# Patient Record
Sex: Female | Born: 1945 | Race: White | Hispanic: No | Smoking: Former smoker
Health system: Southern US, Community
[De-identification: ages and names within clinical notes are randomized; demographics above are authoritative.]

## PROBLEM LIST (undated history)

## (undated) DIAGNOSIS — M1990 Unspecified osteoarthritis, unspecified site: Secondary | ICD-10-CM

## (undated) DIAGNOSIS — I10 Essential (primary) hypertension: Secondary | ICD-10-CM

## (undated) DIAGNOSIS — Z9889 Other specified postprocedural states: Secondary | ICD-10-CM

## (undated) DIAGNOSIS — F419 Anxiety disorder, unspecified: Secondary | ICD-10-CM

## (undated) DIAGNOSIS — F32A Depression, unspecified: Secondary | ICD-10-CM

## (undated) DIAGNOSIS — C801 Malignant (primary) neoplasm, unspecified: Secondary | ICD-10-CM

## (undated) DIAGNOSIS — J45909 Unspecified asthma, uncomplicated: Secondary | ICD-10-CM

## (undated) DIAGNOSIS — E119 Type 2 diabetes mellitus without complications: Secondary | ICD-10-CM

## (undated) DIAGNOSIS — R112 Nausea with vomiting, unspecified: Secondary | ICD-10-CM

## (undated) DIAGNOSIS — J449 Chronic obstructive pulmonary disease, unspecified: Secondary | ICD-10-CM

## (undated) HISTORY — DX: Anxiety disorder, unspecified: F41.9

## (undated) HISTORY — DX: Depression, unspecified: F32.A

## (undated) HISTORY — PX: TUBAL LIGATION: SHX77

## (undated) HISTORY — DX: Nausea with vomiting, unspecified: R11.2

---

## 2004-11-06 ENCOUNTER — Ambulatory Visit: Payer: Self-pay | Admitting: Family Medicine

## 2004-11-09 ENCOUNTER — Ambulatory Visit: Payer: Self-pay | Admitting: Family Medicine

## 2004-11-23 ENCOUNTER — Ambulatory Visit: Payer: Self-pay | Admitting: Family Medicine

## 2007-08-27 ENCOUNTER — Encounter: Payer: Self-pay | Admitting: Family Medicine

## 2009-08-26 DIAGNOSIS — Z9889 Other specified postprocedural states: Secondary | ICD-10-CM

## 2009-08-26 HISTORY — PX: CARDIAC CATHETERIZATION: SHX172

## 2009-08-26 HISTORY — DX: Other specified postprocedural states: Z98.890

## 2010-09-25 NOTE — Letter (Signed)
Summary: rpc chart  rpc chart   Imported By: Curtis Sites 05/28/2010 11:23:43  _____________________________________________________________________  External Attachment:    Type:   Image     Comment:   External Document

## 2011-05-05 ENCOUNTER — Observation Stay (HOSPITAL_COMMUNITY)
Admission: AD | Admit: 2011-05-05 | Discharge: 2011-05-06 | DRG: 287 | Disposition: A | Discharge status: Home / Self Care | Payer: Medicare Other | Source: Other Acute Inpatient Hospital | Attending: Cardiovascular Disease | Admitting: Cardiovascular Disease

## 2011-05-05 DIAGNOSIS — J4489 Other specified chronic obstructive pulmonary disease: Secondary | ICD-10-CM | POA: Insufficient documentation

## 2011-05-05 DIAGNOSIS — R079 Chest pain, unspecified: Secondary | ICD-10-CM | POA: Insufficient documentation

## 2011-05-05 DIAGNOSIS — I2510 Atherosclerotic heart disease of native coronary artery without angina pectoris: Principal | ICD-10-CM | POA: Insufficient documentation

## 2011-05-05 DIAGNOSIS — I252 Old myocardial infarction: Secondary | ICD-10-CM | POA: Insufficient documentation

## 2011-05-05 DIAGNOSIS — J449 Chronic obstructive pulmonary disease, unspecified: Secondary | ICD-10-CM | POA: Insufficient documentation

## 2011-05-05 DIAGNOSIS — I10 Essential (primary) hypertension: Secondary | ICD-10-CM | POA: Insufficient documentation

## 2011-05-05 DIAGNOSIS — E785 Hyperlipidemia, unspecified: Secondary | ICD-10-CM | POA: Insufficient documentation

## 2011-05-05 DIAGNOSIS — E119 Type 2 diabetes mellitus without complications: Secondary | ICD-10-CM | POA: Insufficient documentation

## 2011-05-05 LAB — MRSA PCR SCREENING: MRSA by PCR: NEGATIVE

## 2011-05-05 LAB — HEPARIN LEVEL (UNFRACTIONATED): Heparin Unfractionated: 0.23 IU/mL — ABNORMAL LOW (ref 0.30–0.70)

## 2011-05-06 LAB — BASIC METABOLIC PANEL
BUN: 16 mg/dL (ref 6–23)
CO2: 31 mEq/L (ref 19–32)
Calcium: 9 mg/dL (ref 8.4–10.5)
Chloride: 105 mEq/L (ref 96–112)
Creatinine, Ser: 0.83 mg/dL (ref 0.50–1.10)
GFR calc Af Amer: >60 mL/min (ref >60)
GFR calc non Af Amer: >60 mL/min (ref >60)
Glucose, Bld: 121 mg/dL — ABNORMAL HIGH (ref 70–99)
Potassium: 3.5 mEq/L (ref 3.5–5.1)
Sodium: 143 mEq/L (ref 135–145)

## 2011-05-06 LAB — CBC
MCHC: 35.3 g/dL (ref 30.0–36.0)
MCV: 86.1 fL (ref 78.0–100.0)
Platelets: 198 10*3/uL (ref 150–400)
RDW: 13.1 % (ref 11.5–15.5)
WBC: 6.9 10*3/uL (ref 4.0–10.5)

## 2011-05-06 LAB — HEPARIN LEVEL (UNFRACTIONATED): Heparin Unfractionated: 0.32 IU/mL (ref 0.30–0.70)

## 2011-05-06 LAB — POCT ACTIVATED CLOTTING TIME: Activated Clotting Time: 100 seconds

## 2011-05-06 LAB — PROTIME-INR
INR: 0.99 (ref 0.00–1.49)
Prothrombin Time: 13.3 seconds (ref 11.6–15.2)

## 2011-05-06 LAB — GLUCOSE, CAPILLARY: Glucose-Capillary: 120 mg/dL — ABNORMAL HIGH (ref 70–99)

## 2011-05-25 NOTE — Discharge Summary (Signed)
Marissa Clayton, WEARING              ACCOUNT NO.:  [id number]  MEDICAL RECORD NO.:  [id number]  LOCATION:  2919                         FACILITY:  [facility]  PHYSICIAN:  Ricki Rodriguez, M.D.  DATE OF BIRTH:  04-19-1946  DATE OF ADMISSION:  05/05/2011 DATE OF DISCHARGE:  05/06/2011                              DISCHARGE SUMMARY   The patient was admitted by Dr. Orpah Clayton.  HOSPITAL LOCATION:  2919, bed 1.  FINAL DIAGNOSES: 1. Multivessel native-vessel coronary artery disease. 2. Chest pain. 3. Diabetes mellitus type 2. 4. Hyperlipidemia. 5. Hypertension. 6. Chronic obstructive lung disease with possible bronchitis.  DISCHARGE MEDICATIONS: 1. Albuterol inhaler two puffs four times daily as needed. 2. Amlodipine 2.5 mg daily. 3. Glipizide XL 5 mg daily x5 days, then resume metformin 500 mg     daily. 4. Hydrochlorothiazide 12.5 mg daily. 5. Aspirin 81 mg daily. 6. Multivitamin one daily. 7. Pravachol 40 mg at bedtime.8. Vitamin D3 1000 units daily. 9. The patient to hold metformin for 2 days at least and     lisinopril/hydrochlorothiazide.  DISCHARGE DIET:  Low-sodium, heart-healthy diet with carb-modified medium calorie diet.  DISCHARGE ACTIVITY:  The patient to increase activity gradually with no lifting, pulling, pushing, driving, or sexual activity for 2 days.  WOUND CARE:  The patient to notify right groin pain, swelling or discharge.  Follow up by Dr. Orpah Clayton in 1-2 weeks.  The patient to call [phone] for appointment.  SPECIAL INSTRUCTION:  The patient to stop any activity that causes chest pain, shortness of breath, dizziness, sweating, or excessive weakness.  HISTORY:  This 65 year old white female with multiple cardiac risk factors, presented with 2-week history of chest tightness and shortness of breath with exertion.  The chest pain was retrosternal lasting for about 20 minutes and associated with sweating and radiating to her back.  PHYSICAL  EXAMINATION:  VITAL SIGNS:  Temperature 98, pulse 90, respirations 20, blood pressure 140/70. GENERAL:  The patient is well-built, well-nourished female in no acute distress. HEENT:  The patient is normocephalic and atraumatic with conjunctivae pink.  Sclerae nonicteric. NECK:  No JVD. LUNGS:  Clear with occasional forced expiratory wheezes. HEART:  Normal S1-S2. ABDOMEN:  Soft. EXTREMITIES:  No edema, cyanosis, or clubbing. SKIN:  Warm and dry. NEUROLOGICALLY:  The patient moves all four extremities.  Cranial nerves grossly intact.  LABORATORY DATA:  Normal hemoglobin, hematocrit, WBC count, platelet count.  Near normal electrolytes, BUN, creatinine and glucose.  Normal CK-MB and troponin-I.  INR 0.9.  EKG; sinus rhythm with right ventricular conduction delay.  Chest x-ray; mild COPD with possible bronchitis.  Echocardiogram showed normal LV systolic function with a mild aortic valve and mitral valve regurgitation.  Cardiac catheterization showed mild multivessel native-vessel coronary artery disease with a normal LV systolic function.  HOSPITAL COURSE:  The patient was admitted to telemetry unit. Myocardial infarction was ruled out. Because of the patient's typical chest pain and multiple cardiac risk factor, she underwent heart catheterization that showed 20-30% stenosis of proximal left anterior descending coronary artery and a luminal irregularities of left circumflex coronary artery and right coronary artery with a normal LV systolic function.  The patient's medications  were adjusted and she was discharged home in satisfactory condition with follow up by me in 1 week and by primary care physician Dr. Ernestine Clayton in 1 month.     Ricki Rodriguez, M.D.     ASK/MEDQ  D:  05/24/2011  T:  05/24/2011  Job:  [id number]  cc:   Marissa Conrad, MD  Electronically Signed by Marissa Clayton M.D. on 05/25/2011 10:31:45 AM

## 2013-01-22 ENCOUNTER — Other Ambulatory Visit: Payer: Self-pay | Admitting: Hematology and Oncology

## 2013-01-22 ENCOUNTER — Encounter (INDEPENDENT_AMBULATORY_CARE_PROVIDER_SITE_OTHER): Payer: Medicare Other

## 2013-01-22 DIAGNOSIS — C8590 Non-Hodgkin lymphoma, unspecified, unspecified site: Secondary | ICD-10-CM

## 2013-01-22 DIAGNOSIS — I10 Essential (primary) hypertension: Secondary | ICD-10-CM

## 2013-01-22 DIAGNOSIS — C8581 Other specified types of non-Hodgkin lymphoma, lymph nodes of head, face, and neck: Secondary | ICD-10-CM

## 2013-01-22 DIAGNOSIS — H9209 Otalgia, unspecified ear: Secondary | ICD-10-CM

## 2013-01-22 DIAGNOSIS — E119 Type 2 diabetes mellitus without complications: Secondary | ICD-10-CM

## 2013-01-26 ENCOUNTER — Other Ambulatory Visit: Payer: Self-pay | Admitting: Radiology

## 2013-01-26 ENCOUNTER — Encounter (HOSPITAL_COMMUNITY): Payer: Self-pay | Admitting: Pharmacy Technician

## 2013-01-26 ENCOUNTER — Encounter (HOSPITAL_COMMUNITY)
Admission: RE | Admit: 2013-01-26 | Discharge: 2013-01-26 | Disposition: A | Discharge status: Home / Self Care | Payer: Medicare Other | Source: Ambulatory Visit | Attending: Hematology and Oncology | Admitting: Hematology and Oncology

## 2013-01-26 ENCOUNTER — Other Ambulatory Visit: Payer: Self-pay | Admitting: *Deleted

## 2013-01-26 DIAGNOSIS — C8590 Non-Hodgkin lymphoma, unspecified, unspecified site: Secondary | ICD-10-CM

## 2013-01-26 DIAGNOSIS — R911 Solitary pulmonary nodule: Secondary | ICD-10-CM | POA: Insufficient documentation

## 2013-01-26 DIAGNOSIS — R220 Localized swelling, mass and lump, head: Secondary | ICD-10-CM | POA: Insufficient documentation

## 2013-01-26 DIAGNOSIS — C8589 Other specified types of non-Hodgkin lymphoma, extranodal and solid organ sites: Secondary | ICD-10-CM | POA: Insufficient documentation

## 2013-01-26 LAB — GLUCOSE, CAPILLARY: Glucose-Capillary: 111 mg/dL — ABNORMAL HIGH (ref 70–99)

## 2013-01-26 MED ORDER — FLUDEOXYGLUCOSE F - 18 (FDG) INJECTION
17.7000 | Freq: Once | INTRAVENOUS | Status: AC | PRN
  Administered 2013-01-26: 17.7 via INTRAVENOUS

## 2013-01-27 ENCOUNTER — Telehealth: Payer: Self-pay | Admitting: *Deleted

## 2013-01-27 ENCOUNTER — Telehealth: Payer: Self-pay | Admitting: Oncology

## 2013-01-27 DIAGNOSIS — C8589 Other specified types of non-Hodgkin lymphoma, extranodal and solid organ sites: Secondary | ICD-10-CM

## 2013-01-27 NOTE — Telephone Encounter (Signed)
LVOM FOR PT TO RETURN CALL IN RE NP APPT.  °

## 2013-01-27 NOTE — Telephone Encounter (Signed)
Per staff phone call and POF I have schedueld appts.  JMW  

## 2013-01-28 ENCOUNTER — Other Ambulatory Visit: Payer: Self-pay | Admitting: Radiology

## 2013-01-28 ENCOUNTER — Inpatient Hospital Stay (HOSPITAL_COMMUNITY): Admission: RE | Admit: 2013-01-28 | Payer: Medicare Other | Source: Ambulatory Visit

## 2013-01-28 ENCOUNTER — Telehealth: Payer: Self-pay | Admitting: Oncology

## 2013-01-28 NOTE — Telephone Encounter (Signed)
C/D 01/28/13 for appt. 02/03/13 °

## 2013-01-29 ENCOUNTER — Telehealth: Payer: Self-pay | Admitting: Oncology

## 2013-01-29 NOTE — Telephone Encounter (Signed)
6/3 pof completed by HIM - new pt.

## 2013-01-31 ENCOUNTER — Other Ambulatory Visit: Payer: Self-pay | Admitting: Oncology

## 2013-01-31 DIAGNOSIS — C8510 Unspecified B-cell lymphoma, unspecified site: Secondary | ICD-10-CM | POA: Insufficient documentation

## 2013-02-01 ENCOUNTER — Ambulatory Visit (HOSPITAL_COMMUNITY)
Admission: RE | Admit: 2013-02-01 | Discharge: 2013-02-01 | Disposition: A | Discharge status: Home / Self Care | Payer: Medicare Other | Source: Ambulatory Visit | Attending: Interventional Radiology | Admitting: Interventional Radiology

## 2013-02-01 ENCOUNTER — Encounter (HOSPITAL_COMMUNITY): Payer: Self-pay

## 2013-02-01 ENCOUNTER — Other Ambulatory Visit: Payer: Self-pay | Admitting: Hematology and Oncology

## 2013-02-01 ENCOUNTER — Ambulatory Visit (HOSPITAL_COMMUNITY)
Admission: RE | Admit: 2013-02-01 | Discharge: 2013-02-01 | Disposition: A | Discharge status: Home / Self Care | Payer: Medicare Other | Source: Ambulatory Visit | Attending: Hematology and Oncology | Admitting: Hematology and Oncology

## 2013-02-01 ENCOUNTER — Telehealth: Payer: Self-pay | Admitting: Oncology

## 2013-02-01 DIAGNOSIS — C8589 Other specified types of non-Hodgkin lymphoma, extranodal and solid organ sites: Secondary | ICD-10-CM | POA: Insufficient documentation

## 2013-02-01 DIAGNOSIS — Z8673 Personal history of transient ischemic attack (TIA), and cerebral infarction without residual deficits: Secondary | ICD-10-CM | POA: Insufficient documentation

## 2013-02-01 DIAGNOSIS — J449 Chronic obstructive pulmonary disease, unspecified: Secondary | ICD-10-CM | POA: Insufficient documentation

## 2013-02-01 DIAGNOSIS — J4489 Other specified chronic obstructive pulmonary disease: Secondary | ICD-10-CM | POA: Insufficient documentation

## 2013-02-01 DIAGNOSIS — C8590 Non-Hodgkin lymphoma, unspecified, unspecified site: Secondary | ICD-10-CM

## 2013-02-01 DIAGNOSIS — E119 Type 2 diabetes mellitus without complications: Secondary | ICD-10-CM | POA: Insufficient documentation

## 2013-02-01 DIAGNOSIS — Z79899 Other long term (current) drug therapy: Secondary | ICD-10-CM | POA: Insufficient documentation

## 2013-02-01 DIAGNOSIS — I10 Essential (primary) hypertension: Secondary | ICD-10-CM | POA: Insufficient documentation

## 2013-02-01 HISTORY — DX: Malignant (primary) neoplasm, unspecified: C80.1

## 2013-02-01 HISTORY — DX: Other specified postprocedural states: Z98.890

## 2013-02-01 HISTORY — DX: Unspecified osteoarthritis, unspecified site: M19.90

## 2013-02-01 HISTORY — DX: Essential (primary) hypertension: I10

## 2013-02-01 HISTORY — DX: Unspecified asthma, uncomplicated: J45.909

## 2013-02-01 HISTORY — DX: Chronic obstructive pulmonary disease, unspecified: J44.9

## 2013-02-01 HISTORY — DX: Type 2 diabetes mellitus without complications: E11.9

## 2013-02-01 LAB — CBC
HCT: 36 % (ref 36.0–46.0)
MCH: 28.9 pg (ref 26.0–34.0)
MCHC: 34.2 g/dL (ref 30.0–36.0)
MCV: 84.7 fL (ref 78.0–100.0)
RDW: 12.9 % (ref 11.5–15.5)
WBC: 6.1 10*3/uL (ref 4.0–10.5)

## 2013-02-01 LAB — GLUCOSE, CAPILLARY: Glucose-Capillary: 123 mg/dL — ABNORMAL HIGH (ref 70–99)

## 2013-02-01 MED ORDER — CEFAZOLIN SODIUM-DEXTROSE 2-3 GM-% IV SOLR
2.0000 g | INTRAVENOUS | Status: AC
  Administered 2013-02-01: 2 g via INTRAVENOUS
  Filled 2013-02-01: qty 50

## 2013-02-01 MED ORDER — MIDAZOLAM HCL 2 MG/2ML IJ SOLN
INTRAMUSCULAR | Status: AC
  Filled 2013-02-01: qty 4

## 2013-02-01 MED ORDER — HEPARIN SOD (PORK) LOCK FLUSH 100 UNIT/ML IV SOLN
500.0000 [IU] | Freq: Once | INTRAVENOUS | Status: AC
  Administered 2013-02-01: 500 [IU] via INTRAVENOUS

## 2013-02-01 MED ORDER — SODIUM CHLORIDE 0.9 % IV SOLN
INTRAVENOUS | Status: DC
  Administered 2013-02-01: 08:00:00 via INTRAVENOUS

## 2013-02-01 MED ORDER — FENTANYL CITRATE 0.05 MG/ML IJ SOLN
INTRAMUSCULAR | Status: AC
  Filled 2013-02-01: qty 4

## 2013-02-01 MED ORDER — MIDAZOLAM HCL 2 MG/2ML IJ SOLN
INTRAMUSCULAR | Status: AC | PRN
  Administered 2013-02-01: 2 mg via INTRAVENOUS

## 2013-02-01 MED ORDER — FENTANYL CITRATE 0.05 MG/ML IJ SOLN
INTRAMUSCULAR | Status: AC | PRN
  Administered 2013-02-01: 100 ug via INTRAVENOUS

## 2013-02-01 NOTE — H&P (Signed)
Chief Complaint: "I'm here for a port" Referring Physician:Karb HPI: Marissa Clayton is an 67 y.o. female with newly diagnosed cancer of the nasal/sinus passages. She is to start chemotherapy and is here for port placement. PMHx and meds reviewed. Pt feels well, no recent fevers, illness.  Past Medical History:  Past Medical History  Diagnosis Date  . Diabetes mellitus without complication   . Hypertension   . History of cardiac catheterization 2011    heart cath clean per pt.  [name] COPD (chronic obstructive pulmonary disease)     per CXR last year per pt.  . Asthma   . Arthritis     hands  . Cancer     Past Surgical History:  Past Surgical History  Procedure Laterality Date  . Cardiac catheterization  2011  . Tubal ligation      age 47    Family History: No family history on file.  Social History:  reports that she has quit smoking. Her smoking use included Cigarettes. She smoked 0.00 packs per day. She does not have any smokeless tobacco history on file. She reports that she does not drink alcohol. Her drug history is not on file.  Allergies: No Known Allergies  Medications: albuterol (PROVENTIL HFA;VENTOLIN HFA) 108 (90 BASE) MCG/ACT inhaler (Taking) Sig - Route: Inhale 2 puffs into the lungs every 6 (six) hours as needed for wheezing. - Inhalation Class: Historical Med amLODipine (NORVASC) 2.5 MG tablet (Taking) Sig - Route: Take 2.5 mg by mouth every morning. - Oral Class: Historical Med aspirin EC 81 MG tablet (Taking) Sig - Route: Take 81 mg by mouth every morning. - Oral Class: Historical Med Cholecalciferol (VITAMIN D) 2000 UNITS CAPS (Taking) Sig - Route: Take 2,000 Units by mouth daily. - Oral Class: Historical Med hydrochlorothiazide (HYDRODIURIL) 12.5 MG tablet (Taking) Sig - Route: Take 12.5 mg by mouth every morning. - Oral Class: Historical Med metFORMIN (GLUCOPHAGE) 500 MG tablet (Taking) Sig - Route: Take 500 mg by mouth daily with breakfast. - Oral Class:  Historical Med Multiple Vitamins-Minerals (HAIR/SKIN/NAILS PO) (Taking) Sig - Route: Take 1 tablet by mouth daily. - Oral Class: Historical Med pravastatin (PRAVACHOL) 40 MG tablet (Taking) Sig - Route: Take 40 mg by mouth at bedtime   Please HPI for pertinent positives, otherwise complete 10 system ROS negative.  Physical Exam: BP 127/75  Pulse 74  Temp(Src) 97.7 F (36.5 C) (Oral)  Resp 18  Ht 5\' 7"  (1.702 m)  Wt 185 lb (83.915 kg)  BMI 28.97 kg/m2  SpO2 98% Body mass index is 28.97 kg/(m^2).   General Appearance:  Alert, cooperative, no distress, appears stated age  Head:  Normocephalic, without obvious abnormality, atraumatic  ENT: Unremarkable  Neck: Supple, symmetrical, trachea midline  Lungs:   Clear to auscultation bilaterally, no w/r/r, respirations unlabored without use of accessory muscles.  Chest Wall:  No tenderness or deformity  Heart:  Regular rate and rhythm, S1, S2 normal, no murmur, rub or gallop.  Neurologic: Normal affect, no gross deficits.   Results for orders placed during the hospital encounter of 02/01/13 (from the past 48 hour(s))  APTT     Status: None   Collection Time    02/01/13  8:10 AM      Result Value Range   aPTT 32  24 - 37 seconds  CBC     Status: None   Collection Time    02/01/13  8:10 AM      Result Value Range  WBC 6.1  4.0 - 10.5 K/uL   RBC 4.25  3.87 - 5.11 MIL/uL   Hemoglobin 12.3  12.0 - 15.0 g/dL   HCT 04.5  40.9 - 81.1 %   MCV 84.7  78.0 - 100.0 fL   MCH 28.9  26.0 - 34.0 pg   MCHC 34.2  30.0 - 36.0 g/dL   RDW 91.4  78.2 - 95.6 %   Platelets 266  150 - 400 K/uL  PROTIME-INR     Status: None   Collection Time    02/01/13  8:10 AM      Result Value Range   Prothrombin Time 12.2  11.6 - 15.2 seconds   INR 0.91  0.00 - 1.49   No results found.  Assessment/Plan Nasal/Sinus cancer For portacath, discussed procedure, risks, complications, use of sedation. Labs reviewed, ok Consent signed in chart  Brayton El  PA-C 02/01/2013, 9:16 AM

## 2013-02-01 NOTE — Procedures (Signed)
Successful placement of right IJ approach port-a-cath with tip at the superior caval atrial junction. The catheter is ready for immediate use. No immediate post procedural complications. 

## 2013-02-02 ENCOUNTER — Other Ambulatory Visit: Payer: Medicare Other

## 2013-02-02 ENCOUNTER — Encounter: Payer: Self-pay | Admitting: *Deleted

## 2013-02-03 ENCOUNTER — Other Ambulatory Visit (HOSPITAL_BASED_OUTPATIENT_CLINIC_OR_DEPARTMENT_OTHER): Payer: Medicare Other | Admitting: Lab

## 2013-02-03 ENCOUNTER — Ambulatory Visit: Payer: Medicare Other

## 2013-02-03 ENCOUNTER — Encounter: Payer: Self-pay | Admitting: Oncology

## 2013-02-03 ENCOUNTER — Telehealth: Payer: Self-pay | Admitting: *Deleted

## 2013-02-03 ENCOUNTER — Other Ambulatory Visit (HOSPITAL_COMMUNITY)
Admission: RE | Admit: 2013-02-03 | Discharge: 2013-02-03 | Disposition: A | Discharge status: Home / Self Care | Payer: Medicare Other | Source: Ambulatory Visit | Attending: Oncology | Admitting: Oncology

## 2013-02-03 ENCOUNTER — Ambulatory Visit (HOSPITAL_BASED_OUTPATIENT_CLINIC_OR_DEPARTMENT_OTHER): Payer: Medicare Other | Admitting: Oncology

## 2013-02-03 ENCOUNTER — Other Ambulatory Visit: Payer: Self-pay | Admitting: *Deleted

## 2013-02-03 VITALS — BP 137/85 | HR 84 | Temp 96.8°F | Resp 18 | Ht 67.0 in | Wt 184.2 lb

## 2013-02-03 DIAGNOSIS — R51 Headache: Secondary | ICD-10-CM

## 2013-02-03 DIAGNOSIS — C8510 Unspecified B-cell lymphoma, unspecified site: Secondary | ICD-10-CM

## 2013-02-03 DIAGNOSIS — R1310 Dysphagia, unspecified: Secondary | ICD-10-CM

## 2013-02-03 DIAGNOSIS — C8588 Other specified types of non-Hodgkin lymphoma, lymph nodes of multiple sites: Secondary | ICD-10-CM

## 2013-02-03 DIAGNOSIS — C8589 Other specified types of non-Hodgkin lymphoma, extranodal and solid organ sites: Secondary | ICD-10-CM | POA: Insufficient documentation

## 2013-02-03 DIAGNOSIS — R413 Other amnesia: Secondary | ICD-10-CM

## 2013-02-03 LAB — COMPREHENSIVE METABOLIC PANEL (CC13)
Albumin: 3.8 g/dL (ref 3.5–5.0)
BUN: 17 mg/dL (ref 7.0–26.0)
Calcium: 9.8 mg/dL (ref 8.4–10.4)
Chloride: 103 mEq/L (ref 98–107)
Glucose: 114 mg/dl — ABNORMAL HIGH (ref 70–99)
Potassium: 3.7 mEq/L (ref 3.5–5.1)

## 2013-02-03 LAB — CBC WITH DIFFERENTIAL/PLATELET
Basophils Absolute: 0 10*3/uL (ref 0.0–0.1)
EOS%: 1.8 % (ref 0.0–7.0)
HCT: 35.7 % (ref 34.8–46.6)
HGB: 12.3 g/dL (ref 11.6–15.9)
MCH: 28.9 pg (ref 25.1–34.0)
NEUT%: 62.1 % (ref 38.4–76.8)
Platelets: 247 10*3/uL (ref 145–400)
lymph#: 1.7 10*3/uL (ref 0.9–3.3)

## 2013-02-03 LAB — MORPHOLOGY: PLT EST: ADEQUATE

## 2013-02-03 NOTE — Telephone Encounter (Signed)
Per desk RN i  Have moved appt from tomorrow to Monday. JMW

## 2013-02-03 NOTE — Progress Notes (Signed)
New Patient Hematology-Oncology Evaluation   Marissa Clayton [id number] 07-02-1946 67 y.o. 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 Cleone Slim for further evaluation and treatment of high-grade B-cell non-Hodgkin's lymphoma. The patient's symptoms began about a year ago when she developed pain in her right ear. There was no improvement on antibiotics. She was referred to an ear nose and throat surgeon. A myringotomy tube was placed. She had temporary relief of her symptoms which lasted for about 3 months but then recurred and progressed. Regular x-rays showed no significant abnormalities. Ultimately an  MRI scan of the neck was done on May 8 and showed a posterior superior nasopharyngeal mass eroding the adjacent clivus on the right. Tumor was off the midline. Measurements of the mass not stated on the report. There was cervical lymphadenopathy at level 3/4 on the right measuring 2.6 x 1.8 cm in maximum dimension. Biopsies were taken on May 13 and processed through [facility] lab. Light microscopy consistent with a diffuse, large, B-cell lymphoma. Specimen was processed for flow cytometry. This was sent to integrated oncology lab. 41% of the cells were monoclonal B. lymphocytes expressing CD20, CD19, CD45 but negative for CD5, CD10, with only 3% of cells marking for  NK. or other T-cell markers. There was kappa light chain restriction on B cells.  A PET scan was done on June 3. This showed a hypermetabolic mass in the posterior right oronasopharynx crossing the midline involving lymphoid tissue on the left side as well and extending inferiorly on the right to involve the palatine tonsil. There was high metabolic activity SUV 31.6. There were bilateral small level II lymph nodes. A 6 mm node on the left had   increased metabolic activity. There was no supraclavicular, mediastinal, axillary, or retroperitoneal adenopathy. CT images showed normal spleen. No evidence of bone lesions. An indeterminate 5 mm pulmonary nodule left upper lobe.  Pertinent laboratory studies done on May 30: Hemoglobin 12.5, hematocrit 37, white count 8300, 73% neutrophils, 20% lymphocytes, BUN 17, creatinine 0.9, calcium 9.3, albumin 4.2, LDH 178 with LAD normal 91-180. Hepatitis B surface antigen negative. Serum total immunoglobulins all normal. No monoclonal peak on SPEP.  Echocardiogram done June 2 showed normal left ventricular size and function ejection fraction 55-60%. No significant valve abnormalities.  She has noted increasing nasal congestion, frontal headache, intermittent blurred vision but no diplopia, intermittent dysphagia which she is attributing to increased foul smelling sinus drainage, she has been forgetful and has some difficulty finding words, doing calculations, and spelling.  She lost about 17 pounds when she was told she probably had cancer but states that she has gained it all back.   PMH: Past Medical History  Diagnosis Date  . Diabetes mellitus without complication   . Hypertension   . History of cardiac catheterization 2011    heart cath clean per pt.  [name] COPD (chronic obstructive pulmonary disease)     per CXR last year per pt.  . Asthma   . Arthritis     hands  . Cancer   Negative for MI, ulcers, tuberculosis, asthma, hepatitis, yellow jaundice, mononucleosis, seizure, stroke, female issues.  Past Surgical History  Procedure Laterality Date  . Cardiac catheterization  2011  . Tubal ligation      age 60    Allergies: No Known Allergies  Medications: Extraintestinal one every 6 hours when necessary pain, albuterol inhaler 2 puffs every 6 hours when necessary, Norvasc 2.5 mg daily, coated aspirin 81 mg daily, vitamin D 2000 units by mouth daily, HydroDIURIL 12.5 mg daily, Ativan 1 mg  every 4 hours when necessary anxiety, Glucophage 500 mg every morning, Pravachol 40 mg each bedtime, multivitamins one daily.    Social History:   she is married. She lives in [place]. She has 3 healthy children a son and 2 daughters. She stopped smoking 20 years ago. No alcohol.   Family History: Strong family history of cancer. Mother had mouth cancer. Father lung cancer. Mother recently died of throat cancer. Another brother had lung cancer and another brother had colon cancer. She had 13 siblings in all.  Review of Systems: Constitutional symptoms: See above HEENT: See above Respiratory: Dyspnea related to nasal sinus drainage Cardiovascular: No ischemic type chest pain, pressure, or palpitations  Gastrointestinal ROS: No change in bowel habit Genito-Urinary ROS: No urinary tract symptoms, no vaginal bleeding Hematological and Lymphatic: See above Musculoskeletal: No muscle bone or joint pain. Chest some arthritis in her fingers. Neurologic: See above Dermatologic: No rash or ecchymosis Remaining ROS negative.  Physical Exam: Blood pressure 137/85, pulse 84, temperature 96.8 F (36 C), temperature source Oral, resp. rate 18, height 5\' 7"  (1.702 m), weight 184 lb 3.2 oz (83.553 kg). Wt Readings from Last 3 Encounters:  02/03/13 184 lb 3.2 oz (83.553 kg)  02/01/13 185 lb (83.915 kg)    General appearance: Well-nourished Caucasian woman. Nasal congestion obvious. HENNT: Examination in the naris shows no obvious abnormalities. Oropharynx without erythema exudate or mass. Lymph nodes: No cervical, supraclavicular, or axillary adenopathy Breasts: Lungs: Clear to auscultation resonant to percussion Heart: Regular rhythm no murmur or gallop Vascular: Carotid pulse decreased on the right neck no carotid bruits no cyanosis Abdominal: Soft, nontender, no mass, no organomegaly GU: Extremities: No edema, no calf tenderness Neurologic: Mental status is intact, cranial  nerves grossly normal, I did not test swallowing function, sensation is intact over the face symmetrically by pinprick, palate elevates symmetrically, formal visual testing not done. Motor strength is 5 over 5. Reflexes 1+ symmetric. Sensation intact to vibration over the fingertips by tuning fork exam. Coordination and gait are normal. Finger to finger finger to hand normal. Skin: No rash or ecchymosis    Lab Results: Lab Results  Component Value Date   WBC 6.0 02/03/2013   HGB 12.3 02/03/2013   HCT 35.7 02/03/2013   MCV 84.0 02/03/2013   PLT 247 02/03/2013     Chemistry      Component Value Date/Time   NA 138 02/03/2013 1002   NA 143 05/06/2011 0545   K 3.7 02/03/2013 1002   K 3.5 05/06/2011 0545   CL 103 02/03/2013 1002   CL 105 05/06/2011 0545   CO2 27 02/03/2013 1002   CO2 31 05/06/2011 0545   BUN 17.0 02/03/2013 1002   BUN 16 05/06/2011 0545   CREATININE 1.0 02/03/2013 1002   CREATININE 0.83 05/06/2011 0545      Component Value Date/Time   CALCIUM 9.8 02/03/2013 1002   CALCIUM 9.0 05/06/2011 0545   ALKPHOS 91 02/03/2013 1002   AST 15 02/03/2013 1002   ALT 8 02/03/2013 1002   BILITOT 0.33 02/03/2013 1002       Pathology: See history of present illness     Radiological Studies: See discussion above   Impression and Plan: High-grade B-cell non-Hodgkin's lymphoma involving  the nasopharynx and level 3/4 cervical lymph nodes with evidence for local invasion into the clivus.  Nasopharyngeal lymphomas tend to be very aggressive. They also tend to be T-cell or NK and not B-cell. Hopefully with a B-cell histology prognosis will be better.  Lymphomas occurring in Waldeyer's ring have a propensity for central nervous system involvement. This obligates intrathecal prophylactic chemotherapy as well as systemic chemotherapy.  Plan: I will do a bone marrow aspiration and biopsy, get an MRI of the brain, ask for radiology to assist with a lumbar puncture for cytology. I will get these tests  within the next 48 hours so that we can begin treatment as soon as possible. I will see how she does with the first cycle of chemotherapy before adding intrathecal chemotherapy to subsequent cycles. I will plan on CHOP-Rituxan systemic therapy for a minimal of 4 cycles then consider involved field radiation. I will plan on 4 prophylactic doses of intrathecal methotrexate with hydrocortisone unless spinal fluid is positive at diagnosis. In this day she would need additional doses. I told her to go ahead and begin a five-day course of high-dose prednisone 100 mg daily in divided doses for symptomatic relief until we can start definitive treatment.  Direct face-to-face contact with patient and her daughter for exam, full discussion of diagnosis, prognosis, chemotherapy program, and potential side effects of each drug : over 2 hours. Physician time to arrange scheduling for above staging evaluation and dictation an additional 90 minutes.      Levert Feinstein, MD 02/03/2013, 2:19 PM

## 2013-02-03 NOTE — Progress Notes (Signed)
Checked in new pt that may need assistance with her medical bills.  I explained the new financial assistance policy to her and I also assured her that I may be able to assist with her medication.  Once she brings me her bank statement I will review what assistance we can help with.

## 2013-02-04 ENCOUNTER — Encounter: Payer: Self-pay | Admitting: *Deleted

## 2013-02-04 ENCOUNTER — Ambulatory Visit: Payer: Medicare Other

## 2013-02-04 ENCOUNTER — Encounter: Payer: Self-pay | Admitting: Oncology

## 2013-02-04 NOTE — Progress Notes (Signed)
RECEIVED A FAX FROM [facility] CONCERNING A PRIOR AUTHORIZATION FOR ZOFRAN. THIS REQUEST WAS PLACED IN THE MANAGED CARE BIN. 

## 2013-02-04 NOTE — Progress Notes (Signed)
Faxed zofran odt pa form to [employer].

## 2013-02-05 ENCOUNTER — Other Ambulatory Visit (HOSPITAL_COMMUNITY): Payer: Medicare Other

## 2013-02-05 ENCOUNTER — Other Ambulatory Visit: Payer: Self-pay | Admitting: Oncology

## 2013-02-05 ENCOUNTER — Ambulatory Visit (HOSPITAL_COMMUNITY)
Admission: RE | Admit: 2013-02-05 | Discharge: 2013-02-05 | Disposition: A | Discharge status: Home / Self Care | Payer: Medicare Other | Source: Ambulatory Visit | Attending: Oncology | Admitting: Oncology

## 2013-02-05 ENCOUNTER — Other Ambulatory Visit (HOSPITAL_COMMUNITY)
Admission: RE | Admit: 2013-02-05 | Discharge: 2013-02-05 | Disposition: A | Discharge status: Home / Self Care | Payer: Medicare Other | Source: Ambulatory Visit | Attending: Oncology | Admitting: Oncology

## 2013-02-05 ENCOUNTER — Inpatient Hospital Stay (HOSPITAL_COMMUNITY)
Admission: RE | Admit: 2013-02-05 | Discharge: 2013-02-05 | Disposition: A | Discharge status: Home / Self Care | Payer: Medicare Other | Source: Ambulatory Visit

## 2013-02-05 ENCOUNTER — Other Ambulatory Visit: Payer: Medicare Other

## 2013-02-05 ENCOUNTER — Ambulatory Visit (HOSPITAL_BASED_OUTPATIENT_CLINIC_OR_DEPARTMENT_OTHER): Payer: Medicare Other | Admitting: Oncology

## 2013-02-05 VITALS — BP 156/81 | HR 78 | Temp 97.7°F

## 2013-02-05 DIAGNOSIS — C8589 Other specified types of non-Hodgkin lymphoma, extranodal and solid organ sites: Secondary | ICD-10-CM | POA: Insufficient documentation

## 2013-02-05 DIAGNOSIS — C8510 Unspecified B-cell lymphoma, unspecified site: Secondary | ICD-10-CM

## 2013-02-05 DIAGNOSIS — E119 Type 2 diabetes mellitus without complications: Secondary | ICD-10-CM | POA: Insufficient documentation

## 2013-02-05 DIAGNOSIS — J392 Other diseases of pharynx: Secondary | ICD-10-CM | POA: Insufficient documentation

## 2013-02-05 DIAGNOSIS — I10 Essential (primary) hypertension: Secondary | ICD-10-CM | POA: Insufficient documentation

## 2013-02-05 LAB — GRAM STAIN: Gram Stain: NONE SEEN

## 2013-02-05 LAB — CSF CELL COUNT WITH DIFFERENTIAL: WBC, CSF: 0 /mm3 (ref 0–5)

## 2013-02-05 LAB — CYTOMEGALOVIRUS ANTIBODY, IGG: Cytomegalovirus Ab-IgG: 5.3 U/mL — ABNORMAL HIGH (ref <0.60)

## 2013-02-05 LAB — CMV IGM: CMV IgM: <8 AU/mL (ref <30.00)

## 2013-02-05 LAB — HIV ANTIBODY (ROUTINE TESTING W REFLEX): HIV: NONREACTIVE

## 2013-02-05 LAB — GLUCOSE, CAPILLARY: Glucose-Capillary: 100 mg/dL — ABNORMAL HIGH (ref 70–99)

## 2013-02-05 LAB — GLUCOSE, CSF: Glucose, CSF: 79 mg/dL — ABNORMAL HIGH (ref 43–76)

## 2013-02-05 MED ORDER — ACETAMINOPHEN 325 MG PO TABS: 650.0000 mg | ORAL_TABLET | ORAL | Status: DC | PRN

## 2013-02-05 MED ORDER — GADOBENATE DIMEGLUMINE 529 MG/ML IV SOLN
17.0000 mL | Freq: Once | INTRAVENOUS | Status: AC | PRN
  Administered 2013-02-05: 17 mL via INTRAVENOUS

## 2013-02-05 NOTE — Patient Instructions (Signed)
Bone Marrow Aspiration, Bone Marrow Biopsy  Care After  Read the instructions outlined below and refer to this sheet in the next few weeks. These discharge instructions provide you with general information on caring for yourself after you leave the hospital. Your caregiver may also give you specific instructions. While your treatment has been planned according to the most current medical practices available, unavoidable complications occasionally occur. If you have any problems or questions after discharge, call your caregiver.  FINDING OUT THE RESULTS OF YOUR TEST  Not all test results are available during your visit. If your test results are not back during the visit, make an appointment with your caregiver to find out the results. Do not assume everything is normal if you have not heard from your caregiver or the medical facility. It is important for you to follow up on all of your test results.   HOME CARE INSTRUCTIONS   You have had sedation and may be sleepy or dizzy. Your thinking may not be as clear as usual. For the next 24 hours:   Only take over-the-counter or prescription medicines for pain, discomfort, and or fever as directed by your caregiver.   Do not drink alcohol.   Do not smoke.   Do not drive.   Do not make important legal decisions.   Do not operate heavy machinery.   Do not care for small children by yourself.   Keep your dressing clean and dry. You may replace dressing with a bandage after 24 hours.   You may take a bath or shower after 24 hours.   Use an ice pack for 20 minutes every 2 hours while awake for pain as needed.  SEEK MEDICAL CARE IF:    There is redness, swelling, or increasing pain at the biopsy site.   There is pus coming from the biopsy site.   There is drainage from a biopsy site lasting longer than one day.   An unexplained oral temperature above 102 F (38.9 C) develops.  SEEK IMMEDIATE MEDICAL CARE IF:    You develop a rash.   You have difficulty  breathing.   You develop any reaction or side effects to medications given.  Document Released: 03/01/2005 Document Revised: 11/04/2011 Document Reviewed: 08/09/2008  ExitCare Patient Information 2014 [employer].

## 2013-02-05 NOTE — Procedures (Signed)
.  Procedure: Diagnostic Fluoroscopic Guided Lumbar Puncture. Specimen: CSF, to lab Bleeding: minimal. Complications: None immediate. Patient   -Condition: Stable.  -Disposition:  To short stay, then home after observation.  full Radiology report to follow under Imaging

## 2013-02-05 NOTE — Progress Notes (Signed)
Bone Marrow Biopsy and Aspiration Procedure Note   Informed consent was obtained and potential risks including bleeding, infection and pain were reviewed with the patient.  Red Rule procedure followed  Posterior iliac crest(s) prepped with Betadine.  Lidocaine 2% local anesthesia infiltrated into the subcutaneous tissue.  Premedication: none  Left bone marrow biopsy and  aspirate was obtained.   The procedure was tolerated well and there were no complications.  Specimens sent for routine histopathologic stains and sectioning, flow cytometry and cytogenetics  Indication: staging evaluation for lymphoma  Physician: Levert Feinstein

## 2013-02-05 NOTE — Progress Notes (Signed)
1000-Dressing to L hip CDI.  Pt given instructions to leave in place for 24 hours, and call office if any abnormal symptoms.  Pt verbalizes understanding.

## 2013-02-08 ENCOUNTER — Telehealth: Payer: Self-pay | Admitting: Oncology

## 2013-02-08 ENCOUNTER — Ambulatory Visit (HOSPITAL_BASED_OUTPATIENT_CLINIC_OR_DEPARTMENT_OTHER): Payer: Medicare Other

## 2013-02-08 ENCOUNTER — Telehealth: Payer: Self-pay | Admitting: *Deleted

## 2013-02-08 VITALS — BP 118/72 | HR 97 | Temp 98.3°F | Resp 18

## 2013-02-08 DIAGNOSIS — C8510 Unspecified B-cell lymphoma, unspecified site: Secondary | ICD-10-CM

## 2013-02-08 DIAGNOSIS — Z5111 Encounter for antineoplastic chemotherapy: Secondary | ICD-10-CM

## 2013-02-08 DIAGNOSIS — Z5112 Encounter for antineoplastic immunotherapy: Secondary | ICD-10-CM

## 2013-02-08 DIAGNOSIS — C8589 Other specified types of non-Hodgkin lymphoma, extranodal and solid organ sites: Secondary | ICD-10-CM

## 2013-02-08 MED ORDER — DOXORUBICIN HCL CHEMO IV INJECTION 2 MG/ML
50.0000 mg/m2 | Freq: Once | INTRAVENOUS | Status: AC
  Administered 2013-02-08: 100 mg via INTRAVENOUS
  Filled 2013-02-08: qty 50

## 2013-02-08 MED ORDER — HEPARIN SOD (PORK) LOCK FLUSH 100 UNIT/ML IV SOLN
500.0000 [IU] | Freq: Once | INTRAVENOUS | Status: AC | PRN
  Administered 2013-02-08: 500 [IU]
  Filled 2013-02-08: qty 5

## 2013-02-08 MED ORDER — SODIUM CHLORIDE 0.9 % IV SOLN
750.0000 mg/m2 | Freq: Once | INTRAVENOUS | Status: AC
  Administered 2013-02-08: 1500 mg via INTRAVENOUS
  Filled 2013-02-08: qty 75

## 2013-02-08 MED ORDER — SODIUM CHLORIDE 0.9 % IJ SOLN
10.0000 mL | INTRAMUSCULAR | Status: DC | PRN
  Administered 2013-02-08: 10 mL
  Filled 2013-02-08: qty 10

## 2013-02-08 MED ORDER — SODIUM CHLORIDE 0.9 % IV SOLN
375.0000 mg/m2 | Freq: Once | INTRAVENOUS | Status: AC
  Administered 2013-02-08: 700 mg via INTRAVENOUS
  Filled 2013-02-08: qty 70

## 2013-02-08 MED ORDER — ACETAMINOPHEN 325 MG PO TABS
650.0000 mg | ORAL_TABLET | Freq: Once | ORAL | Status: AC
  Administered 2013-02-08: 650 mg via ORAL

## 2013-02-08 MED ORDER — ONDANSETRON 16 MG/50ML IVPB (CHCC)
16.0000 mg | Freq: Once | INTRAVENOUS | Status: AC
  Administered 2013-02-08: 16 mg via INTRAVENOUS

## 2013-02-08 MED ORDER — SODIUM CHLORIDE 0.9 % IV SOLN
Freq: Once | INTRAVENOUS | Status: AC
  Administered 2013-02-08: 09:00:00 via INTRAVENOUS

## 2013-02-08 MED ORDER — VINCRISTINE SULFATE CHEMO INJECTION 1 MG/ML
2.0000 mg | Freq: Once | INTRAVENOUS | Status: AC
  Administered 2013-02-08: 2 mg via INTRAVENOUS
  Filled 2013-02-08: qty 2

## 2013-02-08 MED ORDER — DEXAMETHASONE SODIUM PHOSPHATE 20 MG/5ML IJ SOLN
20.0000 mg | Freq: Once | INTRAMUSCULAR | Status: AC
  Administered 2013-02-08: 20 mg via INTRAVENOUS

## 2013-02-08 MED ORDER — DIPHENHYDRAMINE HCL 25 MG PO CAPS
50.0000 mg | ORAL_CAPSULE | Freq: Once | ORAL | Status: AC
  Administered 2013-02-08: 50 mg via ORAL

## 2013-02-08 NOTE — Telephone Encounter (Signed)
Per staff message and POF I have scheduled appts.  JMW  

## 2013-02-08 NOTE — Telephone Encounter (Signed)
gv and printed appt sched and avs for pt....MW added tx   °

## 2013-02-08 NOTE — Patient Instructions (Signed)
[facility] Discharge Instructions for Patients Receiving Chemotherapy  Today you received the following chemotherapy agents Adriamycin/Vincristine/Cytoxan/Rituxan To help prevent nausea and vomiting after your treatment, we encourage you to take your nausea medication as prescribed.  If you develop nausea and vomiting that is not controlled by your nausea medication, call the clinic.   BELOW ARE SYMPTOMS THAT SHOULD BE REPORTED IMMEDIATELY:  *FEVER GREATER THAN 100.5 F  *CHILLS WITH OR WITHOUT FEVER  NAUSEA AND VOMITING THAT IS NOT CONTROLLED WITH YOUR NAUSEA MEDICATION  *UNUSUAL SHORTNESS OF BREATH  *UNUSUAL BRUISING OR BLEEDING  TENDERNESS IN MOUTH AND THROAT WITH OR WITHOUT PRESENCE OF ULCERS  *URINARY PROBLEMS  *BOWEL PROBLEMS  UNUSUAL RASH Items with * indicate a potential emergency and should be followed up as soon as possible.  Feel free to call the clinic you have any questions or concerns. The clinic phone number is [phone].     Rituximab injection What is this medicine? RITUXIMAB (ri TUX i mab) is a monoclonal antibody. This medicine changes the way the body's immune system works. It is used commonly to treat non-Hodgkin's lymphoma and other conditions. In cancer cells, this drug targets a specific protein within cancer cells and stops the cancer cells from growing. It is also used to treat rhuematoid arthritis (RA). In RA, this medicine slow the inflammatory process and help reduce joint pain and swelling. This medicine is often used with other cancer or arthritis medications. This medicine may be used for other purposes; ask your health care provider or pharmacist if you have questions. What should I tell my health care provider before I take this medicine? They need to know if you have any of these conditions: -blood disorders -heart disease -history of hepatitis B -infection (especially a virus infection such as chickenpox, cold sores, or  herpes) -irregular heartbeat -kidney disease -lung or breathing disease, like asthma -lupus -an unusual or allergic reaction to rituximab, mouse proteins, other medicines, foods, dyes, or preservatives -pregnant or trying to get pregnant -breast-feeding How should I use this medicine? This medicine is for infusion into a vein. It is administered in a hospital or clinic by a specially trained health care professional. A special MedGuide will be given to you by the pharmacist with each prescription and refill. Be sure to read this information carefully each time. Talk to your pediatrician regarding the use of this medicine in children. This medicine is not approved for use in children. Overdosage: If you think you have taken too much of this medicine contact a poison control center or emergency room at once. NOTE: This medicine is only for you. Do not share this medicine with others. What if I miss a dose? It is important not to miss a dose. Call your doctor or health care professional if you are unable to keep an appointment. What may interact with this medicine? -cisplatin -medicines for blood pressure -some other medicines for arthritis -vaccines This list may not describe all possible interactions. Give your health care provider a list of all the medicines, herbs, non-prescription drugs, or dietary supplements you use. Also tell them if you smoke, drink alcohol, or use illegal drugs. Some items may interact with your medicine. What should I watch for while using this medicine? Report any side effects that you notice during your treatment right away, such as changes in your breathing, fever, chills, dizziness or lightheadedness. These effects are more common with the first dose. Visit your prescriber or health care professional for  checks on your progress. You will need to have regular blood work. Report any other side effects. The side effects of this medicine can continue after you finish  your treatment. Continue your course of treatment even though you feel ill unless your doctor tells you to stop. Call your doctor or health care professional for advice if you get a fever, chills or sore throat, or other symptoms of a cold or flu. Do not treat yourself. This drug decreases your body's ability to fight infections. Try to avoid being around people who are sick. This medicine may increase your risk to bruise or bleed. Call your doctor or health care professional if you notice any unusual bleeding. Be careful brushing and flossing your teeth or using a toothpick because you may get an infection or bleed more easily. If you have any dental work done, tell your dentist you are receiving this medicine. Avoid taking products that contain aspirin, acetaminophen, ibuprofen, naproxen, or ketoprofen unless instructed by your doctor. These medicines may hide a fever. Do not become pregnant while taking this medicine. Women should inform their doctor if they wish to become pregnant or think they might be pregnant. There is a potential for serious side effects to an unborn child. Talk to your health care professional or pharmacist for more information. Do not breast-feed an infant while taking this medicine. What side effects may I notice from receiving this medicine? Side effects that you should report to your doctor or health care professional as soon as possible: -allergic reactions like skin rash, itching or hives, swelling of the face, lips, or tongue -low blood counts - this medicine may decrease the number of white blood cells, red blood cells and platelets. You may be at increased risk for infections and bleeding. -signs of infection - fever or chills, cough, sore throat, pain or difficulty passing urine -signs of decreased platelets or bleeding - bruising, pinpoint red spots on the skin, black, tarry stools, blood in the urine -signs of decreased red blood cells - unusually weak or tired,  fainting spells, lightheadedness -breathing problems -confused, not responsive -chest pain -fast, irregular heartbeat -feeling faint or lightheaded, falls -mouth sores -redness, blistering, peeling or loosening of the skin, including inside the mouth -stomach pain -swelling of the ankles, feet, or hands -trouble passing urine or change in the amount of urine Side effects that usually do not require medical attention (report to your doctor or other health care professional if they continue or are bothersome): -anxiety -headache -loss of appetite -muscle aches -nausea -night sweats This list may not describe all possible side effects. Call your doctor for medical advice about side effects. You may report side effects to FDA at 1-800-FDA-1088. Where should I keep my medicine? This drug is given in a hospital or clinic and will not be stored at home. NOTE: This sheet is a summary. It may not cover all possible information. If you have questions about this medicine, talk to your doctor, pharmacist, or health care provider.  2013, [employer]. (04/11/2008 2:04:59 PM)     Vincristine injection What is this medicine? VINCRISTINE (vin KRIS teen) is a chemotherapy drug. It slows the growth of cancer cells. This medicine is used to treat many types of cancer like Hodgkin's disease, leukemia, non-Hodgkin's lymphoma, neuroblastoma (brain cancer), rhabdomyosarcoma, and Wilms' tumor. This medicine may be used for other purposes; ask your health care provider or pharmacist if you have questions. What should I tell my health care provider before I  take this medicine? They need to know if you have any of these conditions: -blood disorders -gout -infection (especially chickenpox, cold sores, or herpes) -kidney disease -liver disease -lung disease -nervous system disease like Charcot-Marie-Tooth (CMT) -recent or ongoing radiation therapy -an unusual or allergic reaction to  vincristine, other chemotherapy agents, other medicines, foods, dyes, or preservatives -pregnant or trying to get pregnant -breast-feeding How should I use this medicine? This drug is given as an infusion into a vein. It is administered in a hospital or clinic by a specially trained health care professional. If you have pain, swelling, burning, or any unusual feeling around the site of your injection, tell your health care professional right away. Talk to your pediatrician regarding the use of this medicine in children. While this drug may be prescribed for selected conditions, precautions do apply. Overdosage: If you think you have taken too much of this medicine contact a poison control center or emergency room at once. NOTE: This medicine is only for you. Do not share this medicine with others. What if I miss a dose? It is important not to miss your dose. Call your doctor or health care professional if you are unable to keep an appointment. What may interact with this medicine? Do not take this medicine with any of the following medications: -itraconazole -mibefradil -voriconazole This medicine may also interact with the following medications: -cyclosporine -erythromycin -fluconazole -ketoconazole -medicines for HIV like delavirdine, efavirenz, nevirapine -medicines for seizures like ethotoin, fosphenotoin, phenytoin -medicines to increase blood counts like filgrastim, pegfilgrastim, sargramostim -other chemotherapy drugs like cisplatin, L-asparaginase, methotrexate, mitomycin, paclitaxel -pegaspargase -vaccines -zalcitabine, ddC Talk to your doctor or health care professional before taking any of these medicines: -acetaminophen -aspirin -ibuprofen -ketoprofen -naproxen This list may not describe all possible interactions. Give your health care provider a list of all the medicines, herbs, non-prescription drugs, or dietary supplements you use. Also tell them if you smoke, drink  alcohol, or use illegal drugs. Some items may interact with your medicine. What should I watch for while using this medicine? Your condition will be monitored carefully while you are receiving this medicine. You will need important blood work done while you are taking this medicine. This drug may make you feel generally unwell. This is not uncommon, as chemotherapy can affect healthy cells as well as cancer cells. Report any side effects. Continue your course of treatment even though you feel ill unless your doctor tells you to stop. In some cases, you may be given additional medicines to help with side effects. Follow all directions for their use. Call your doctor or health care professional for advice if you get a fever, chills or sore throat, or other symptoms of a cold or flu. Do not treat yourself. Avoid taking products that contain aspirin, acetaminophen, ibuprofen, naproxen, or ketoprofen unless instructed by your doctor. These medicines may hide a fever. Do not become pregnant while taking this medicine. Women should inform their doctor if they wish to become pregnant or think they might be pregnant. There is a potential for serious side effects to an unborn child. Talk to your health care professional or pharmacist for more information. Do not breast-feed an infant while taking this medicine. Men may have a lower sperm count while taking this medicine. Talk to your doctor if you plan to father a child. What side effects may I notice from receiving this medicine? Side effects that you should report to your doctor or health care professional as soon as possible: -allergic  reactions like skin rash, itching or hives, swelling of the face, lips, or tongue -breathing problems -confusion or changes in emotions or moods -constipation -cough -mouth sores -muscle weakness -nausea and vomiting -pain, swelling, redness or irritation at the injection site -pain, tingling, numbness in the hands or  feet -problems with balance, talking, walking -seizures -stomach pain -trouble passing urine or change in the amount of urine Side effects that usually do not require medical attention (report to your doctor or health care professional if they continue or are bothersome): -diarrhea -hair loss -jaw pain -loss of appetite This list may not describe all possible side effects. Call your doctor for medical advice about side effects. You may report side effects to FDA at 1-800-FDA-1088. Where should I keep my medicine? This drug is given in a hospital or clinic and will not be stored at home. NOTE: This sheet is a summary. It may not cover all possible information. If you have questions about this medicine, talk to your doctor, pharmacist, or health care provider.  2013, [employer] Standard. (05/09/2008 5:17:13 PM)   Cyclophosphamide injection (Cytoxan) What is this medicine? CYCLOPHOSPHAMIDE (sye kloe FOSS fa mide) is a chemotherapy drug. It slows the growth of cancer cells. This medicine is used to treat many types of cancer like lymphoma, myeloma, leukemia, breast cancer, and ovarian cancer, to name a few. It is also used to treat nephrotic syndrome in children. This medicine may be used for other purposes; ask your health care provider or pharmacist if you have questions. What should I tell my health care provider before I take this medicine? They need to know if you have any of these conditions: -blood disorders -history of other chemotherapy -history of radiation therapy -infection -kidney disease -liver disease -tumors in the bone marrow -an unusual or allergic reaction to cyclophosphamide, other chemotherapy, other medicines, foods, dyes, or preservatives -pregnant or trying to get pregnant -breast-feeding How should I use this medicine? This drug is usually given as an injection into a vein or muscle or by infusion into a vein. It is administered in a hospital or clinic by a  specially trained health care professional. Talk to your pediatrician regarding the use of this medicine in children. While this drug may be prescribed for selected conditions, precautions do apply. Overdosage: If you think you have taken too much of this medicine contact a poison control center or emergency room at once. NOTE: This medicine is only for you. Do not share this medicine with others. What if I miss a dose? It is important not to miss your dose. Call your doctor or health care professional if you are unable to keep an appointment. What may interact with this medicine? Do not take this medicine with any of the following medications: -mibefradil -nalidixic acid This medicine may also interact with the following medications: -doxorubicin -etanercept -medicines to increase blood counts like filgrastim, pegfilgrastim, sargramostim -medicines that block muscle or nerve pain -St. John's Wort -phenobarbital -succinylcholine chloride -trastuzumab -vaccines Talk to your doctor or health care professional before taking any of these medicines: -acetaminophen -aspirin -ibuprofen -ketoprofen -naproxen This list may not describe all possible interactions. Give your health care provider a list of all the medicines, herbs, non-prescription drugs, or dietary supplements you use. Also tell them if you smoke, drink alcohol, or use illegal drugs. Some items may interact with your medicine. What should I watch for while using this medicine? Visit your doctor for checks on your progress. This drug may make you  feel generally unwell. This is not uncommon, as chemotherapy can affect healthy cells as well as cancer cells. Report any side effects. Continue your course of treatment even though you feel ill unless your doctor tells you to stop. Drink water or other fluids as directed. Urinate often, even at night. In some cases, you may be given additional medicines to help with side effects. Follow  all directions for their use. Call your doctor or health care professional for advice if you get a fever, chills or sore throat, or other symptoms of a cold or flu. Do not treat yourself. This drug decreases your body's ability to fight infections. Try to avoid being around people who are sick. This medicine may increase your risk to bruise or bleed. Call your doctor or health care professional if you notice any unusual bleeding. Be careful brushing and flossing your teeth or using a toothpick because you may get an infection or bleed more easily. If you have any dental work done, tell your dentist you are receiving this medicine. Avoid taking products that contain aspirin, acetaminophen, ibuprofen, naproxen, or ketoprofen unless instructed by your doctor. These medicines may hide a fever. Do not become pregnant while taking this medicine. Women should inform their doctor if they wish to become pregnant or think they might be pregnant. There is a potential for serious side effects to an unborn child. Talk to your health care professional or pharmacist for more information. Do not breast-feed an infant while taking this medicine. Men should inform their doctor if they wish to father a child. This medicine may lower sperm counts. If you are going to have surgery, tell your doctor or health care professional that you have taken this medicine. What side effects may I notice from receiving this medicine? Side effects that you should report to your doctor or health care professional as soon as possible: -allergic reactions like skin rash, itching or hives, swelling of the face, lips, or tongue -low blood counts - this medicine may decrease the number of white blood cells, red blood cells and platelets. You may be at increased risk for infections and bleeding. -signs of infection - fever or chills, cough, sore throat, pain or difficulty passing urine -signs of decreased platelets or bleeding - bruising,  pinpoint red spots on the skin, black, tarry stools, blood in the urine -signs of decreased red blood cells - unusually weak or tired, fainting spells, lightheadedness -breathing problems -dark urine -mouth sores -pain, swelling, redness at site where injected -swelling of the ankles, feet, hands -trouble passing urine or change in the amount of urine -weight gain -yellowing of the eyes or skin Side effects that usually do not require medical attention (report to your doctor or health care professional if they continue or are bothersome): -changes in nail or skin color -diarrhea -hair loss -loss of appetite -missed menstrual periods -nausea, vomiting -stomach pain This list may not describe all possible side effects. Call your doctor for medical advice about side effects. You may report side effects to FDA at 1-800-FDA-1088. Where should I keep my medicine? This drug is given in a hospital or clinic and will not be stored at home. NOTE: This sheet is a summary. It may not cover all possible information. If you have questions about this medicine, talk to your doctor, pharmacist, or health care provider.  2013, [employer] Standard. (11/17/2007 2:32:25 PM)    Doxorubicin injection (Adriamycin) What is this medicine? DOXORUBICIN (dox oh ROO bi sin) is a  chemotherapy drug. It is used to treat many kinds of cancer like Hodgkin's disease, leukemia, non-Hodgkin's lymphoma, neuroblastoma, sarcoma, and Wilms' tumor. It is also used to treat bladder cancer, breast cancer, lung cancer, ovarian cancer, stomach cancer, and thyroid cancer. This medicine may be used for other purposes; ask your health care provider or pharmacist if you have questions. What should I tell my health care provider before I take this medicine? They need to know if you have any of these conditions: -blood disorders -heart disease, recent heart attack -infection (especially a virus infection such as chickenpox, cold  sores, or herpes) -irregular heartbeat -liver disease -recent or ongoing radiation therapy -an unusual or allergic reaction to doxorubicin, other chemotherapy agents, other medicines, foods, dyes, or preservatives -pregnant or trying to get pregnant -breast-feeding How should I use this medicine? This drug is given as an infusion into a vein. It is administered in a hospital or clinic by a specially trained health care professional. If you have pain, swelling, burning or any unusual feeling around the site of your injection, tell your health care professional right away. Talk to your pediatrician regarding the use of this medicine in children. Special care may be needed. Overdosage: If you think you have taken too much of this medicine contact a poison control center or emergency room at once. NOTE: This medicine is only for you. Do not share this medicine with others. What if I miss a dose? It is important not to miss your dose. Call your doctor or health care professional if you are unable to keep an appointment. What may interact with this medicine? Do not take this medicine with any of the following medications: -cisapride -droperidol -halofantrine -pimozide -zidovudine This medicine may also interact with the following medications: -chloroquine -chlorpromazine -clarithromycin -cyclophosphamide -cyclosporine -erythromycin -medicines for depression, anxiety, or psychotic disturbances -medicines for irregular heart beat like amiodarone, bepridil, dofetilide, encainide, flecainide, propafenone, quinidine -medicines for seizures like ethotoin, fosphenytoin, phenytoin -medicines for nausea, vomiting like dolasetron, ondansetron, palonosetron -medicines to increase blood counts like filgrastim, pegfilgrastim, sargramostim -methadone -methotrexate -pentamidine -progesterone -vaccines -verapamil Talk to your doctor or health care professional before taking any of these  medicines: -acetaminophen -aspirin -ibuprofen -ketoprofen -naproxen This list may not describe all possible interactions. Give your health care provider a list of all the medicines, herbs, non-prescription drugs, or dietary supplements you use. Also tell them if you smoke, drink alcohol, or use illegal drugs. Some items may interact with your medicine. What should I watch for while using this medicine? Your condition will be monitored carefully while you are receiving this medicine. You will need important blood work done while you are taking this medicine. This drug may make you feel generally unwell. This is not uncommon, as chemotherapy can affect healthy cells as well as cancer cells. Report any side effects. Continue your course of treatment even though you feel ill unless your doctor tells you to stop. Your urine may turn red for a few days after your dose. This is not blood. If your urine is dark or brown, call your doctor. In some cases, you may be given additional medicines to help with side effects. Follow all directions for their use. Call your doctor or health care professional for advice if you get a fever, chills or sore throat, or other symptoms of a cold or flu. Do not treat yourself. This drug decreases your body's ability to fight infections. Try to avoid being around people who are sick. This medicine may  increase your risk to bruise or bleed. Call your doctor or health care professional if you notice any unusual bleeding. Be careful brushing and flossing your teeth or using a toothpick because you may get an infection or bleed more easily. If you have any dental work done, tell your dentist you are receiving this medicine. Avoid taking products that contain aspirin, acetaminophen, ibuprofen, naproxen, or ketoprofen unless instructed by your doctor. These medicines may hide a fever. Men and women of childbearing age should use effective birth control methods while using taking this  medicine. Do not become pregnant while taking this medicine. There is a potential for serious side effects to an unborn child. Talk to your health care professional or pharmacist for more information. Do not breast-feed an infant while taking this medicine. Do not let others touch your urine or other body fluids for 5 days after each treatment with this medicine. Caregivers should wear latex gloves to avoid touching body fluids during this time. What side effects may I notice from receiving this medicine? Side effects that you should report to your doctor or health care professional as soon as possible: -allergic reactions like skin rash, itching or hives, swelling of the face, lips, or tongue -low blood counts - this medicine may decrease the number of white blood cells, red blood cells and platelets. You may be at increased risk for infections and bleeding. -signs of infection - fever or chills, cough, sore throat, pain or difficulty passing urine -signs of decreased platelets or bleeding - bruising, pinpoint red spots on the skin, black, tarry stools, blood in the urine -signs of decreased red blood cells - unusually weak or tired, fainting spells, lightheadedness -breathing problems -chest pain -fast, irregular heartbeat -mouth sores -nausea, vomiting -pain, swelling, redness at site where injected -pain, tingling, numbness in the hands or feet -swelling of ankles, feet, or hands -unusual bleeding or bruising Side effects that usually do not require medical attention (report to your doctor or health care professional if they continue or are bothersome): -diarrhea -facial flushing -hair loss -loss of appetite -missed menstrual periods -nail discoloration or damage -red or watery eyes -red colored urine -stomach upset This list may not describe all possible side effects. Call your doctor for medical advice about side effects. You may report side effects to FDA at 1-800-FDA-1088. Where  should I keep my medicine? This drug is given in a hospital or clinic and will not be stored at home. NOTE: This sheet is a summary. It may not cover all possible information. If you have questions about this medicine, talk to your doctor, pharmacist, or health care provider.  2013, [employer] Standard. (12/01/2007 5:07:32 PM)

## 2013-02-09 ENCOUNTER — Ambulatory Visit (HOSPITAL_BASED_OUTPATIENT_CLINIC_OR_DEPARTMENT_OTHER): Payer: Medicare Other

## 2013-02-09 ENCOUNTER — Telehealth: Payer: Self-pay | Admitting: *Deleted

## 2013-02-09 VITALS — BP 129/77 | HR 78 | Temp 98.5°F

## 2013-02-09 DIAGNOSIS — C8510 Unspecified B-cell lymphoma, unspecified site: Secondary | ICD-10-CM

## 2013-02-09 DIAGNOSIS — C8589 Other specified types of non-Hodgkin lymphoma, extranodal and solid organ sites: Secondary | ICD-10-CM

## 2013-02-09 LAB — CSF CULTURE W GRAM STAIN: Gram Stain: NONE SEEN

## 2013-02-09 MED ORDER — PEGFILGRASTIM INJECTION 6 MG/0.6ML
6.0000 mg | Freq: Once | SUBCUTANEOUS | Status: AC
  Administered 2013-02-09: 6 mg via SUBCUTANEOUS
  Filled 2013-02-09: qty 0.6

## 2013-02-09 NOTE — Telephone Encounter (Signed)
Per Dr. Cyndie Chime, informed pt that spinal tap/bone scans were negative.  Pt also states she does have nausea meds for home but "can't remember what it is but will call if I need anything"

## 2013-02-09 NOTE — Patient Instructions (Addendum)

## 2013-02-09 NOTE — Telephone Encounter (Signed)
Called patient who is doing well.  Deniers any questions.  Is drinking water and fluids well.  Denies nausea.  Was slightly constipated but has resolved this today by drinking hot coffee.

## 2013-02-09 NOTE — Telephone Encounter (Signed)
Message copied by Augusto Garbe on Tue Feb 09, 2013  1:32 PM ------      Message from: Corky Sing      Created: Mon Feb 08, 2013  2:47 PM      Regarding: Chemo follow up call      Contact: [phone]       First time Adriamycin/Vincristine/Cytoxan/Rituxan. Dr. Cyndie Chime patient. Please call.            Thanks,      Santina Evans, RN ------

## 2013-02-09 NOTE — Telephone Encounter (Signed)
Message copied by Gala Romney on Tue Feb 09, 2013 12:47 PM ------      Message from: Levert Feinstein      Created: Fri Feb 05, 2013  5:06 PM       Call pt MRI brain no concerns ------

## 2013-02-09 NOTE — Telephone Encounter (Signed)
Informed pt per Dr. Cyndie Chime MRI brain no concerns.  Pt questioning "results of spine tap and bone scans?"  Informed pt office will call back re: results.

## 2013-02-11 MED ORDER — TRAMADOL HCL 50 MG PO TABS: ORAL_TABLET | ORAL | Status: DC

## 2013-02-11 MED ORDER — PREDNISONE 20 MG PO TABS: ORAL_TABLET | ORAL | Status: DC

## 2013-02-11 MED ORDER — ONDANSETRON 8 MG PO TBDP: 8.0000 mg | ORAL_TABLET | Freq: Four times a day (QID) | ORAL | Status: DC | PRN

## 2013-02-11 MED ORDER — ALLOPURINOL 300 MG PO TABS: 300.0000 mg | ORAL_TABLET | Freq: Every day | ORAL | Status: DC

## 2013-02-11 NOTE — Addendum Note (Signed)
Addended by: Gala Romney on: 02/11/2013 04:07 PM   Modules accepted: Orders

## 2013-02-17 LAB — TISSUE HYBRIDIZATION (BONE MARROW)-NCBH

## 2013-02-17 LAB — CHROMOSOME ANALYSIS, BONE MARROW

## 2013-02-19 ENCOUNTER — Ambulatory Visit (HOSPITAL_BASED_OUTPATIENT_CLINIC_OR_DEPARTMENT_OTHER): Payer: Medicare Other | Admitting: Lab

## 2013-02-19 ENCOUNTER — Ambulatory Visit (HOSPITAL_BASED_OUTPATIENT_CLINIC_OR_DEPARTMENT_OTHER): Payer: Medicare Other | Admitting: Nurse Practitioner

## 2013-02-19 ENCOUNTER — Telehealth: Payer: Self-pay | Admitting: Oncology

## 2013-02-19 VITALS — BP 129/83 | HR 99 | Temp 97.5°F | Resp 18 | Ht 67.0 in | Wt 178.7 lb

## 2013-02-19 DIAGNOSIS — C8589 Other specified types of non-Hodgkin lymphoma, extranodal and solid organ sites: Secondary | ICD-10-CM

## 2013-02-19 DIAGNOSIS — C8510 Unspecified B-cell lymphoma, unspecified site: Secondary | ICD-10-CM

## 2013-02-19 LAB — CBC WITH DIFFERENTIAL/PLATELET
BASO%: 1.1 % (ref 0.0–2.0)
EOS%: 3 % (ref 0.0–7.0)
HGB: 12.1 g/dL (ref 11.6–15.9)
MCH: 28.7 pg (ref 25.1–34.0)
MCHC: 34.4 g/dL (ref 31.5–36.0)
RDW: 13.2 % (ref 11.2–14.5)
lymph#: 1.1 10*3/uL (ref 0.9–3.3)

## 2013-02-19 LAB — COMPREHENSIVE METABOLIC PANEL (CC13)
AST: 33 U/L (ref 5–34)
Albumin: 3.8 g/dL (ref 3.5–5.0)
Alkaline Phosphatase: 102 U/L (ref 40–150)
Potassium: 3.8 mEq/L (ref 3.5–5.1)
Sodium: 139 mEq/L (ref 136–145)
Total Protein: 7.4 g/dL (ref 6.4–8.3)

## 2013-02-19 NOTE — Telephone Encounter (Signed)
gv and printed appt sched and avs for pt  °

## 2013-02-19 NOTE — Progress Notes (Signed)
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 Staging bone marrow biopsy on 02/05/2013 was slightly hypercellular with trilineage hematopoiesis and no evidence for involvement with lymphoma. A lumbar puncture was done on on 02/05/2013. The cerebrospinal fluid showed no malignant cells.  She completed cycle #1 CHOP/Rituxan beginning 02/08/2013 (she began the prednisone portion of the treatment on 02/03/2013 for symptomatic relief).  She developed nausea on day 2. The nausea continued intermittently for about 5 days. No mouth sores. She noted constipation after the chemotherapy. She took a stool softener. No numbness or tingling in her hands or feet. Ear pain has resolved. She is having intermittent leg cramps. No leg swelling. She has an intermittent cough. She mainly notes the cough at night time. No fever or shortness of breath.   Objective: Blood pressure 129/83, pulse 99, temperature 97.5 F (36.4 C), temperature source Oral, resp. rate 18, height 5\' 7"  (1.702 m), weight 178 lb 11.2 oz (81.058 kg).  Oropharynx is without thrush or ulceration. No palpable cervical, supraclavicular or axillary lymph nodes. Lungs are clear. No wheezes or rales. Regular cardiac rhythm. Port-A-Cath site is without erythema. Abdomen is soft and nontender. No mass. No organomegaly. Extremities are without edema. Calves soft and nontender. Motor strength 5 over 5. Knee DTRs 1+, symmetric. Vibratory sense intact over the fingertips per tuning fork exam.  Lab Results: Lab Results  Component Value Date   WBC 6.0 02/03/2013   HGB 12.3 02/03/2013   HCT 35.7 02/03/2013   MCV 84.0 02/03/2013   PLT 247 02/03/2013    Chemistry:    Chemistry      Component Value Date/Time   NA 138 02/03/2013 1002   NA 143 05/06/2011  0545   K 3.7 02/03/2013 1002   K 3.5 05/06/2011 0545   CL 103 02/03/2013 1002   CL 105 05/06/2011 0545   CO2 27 02/03/2013 1002   CO2 31 05/06/2011 0545   BUN 17.0 02/03/2013 1002   BUN 16 05/06/2011 0545   CREATININE 1.0 02/03/2013 1002   CREATININE 0.83 05/06/2011 0545      Component Value Date/Time   CALCIUM 9.8 02/03/2013 1002   CALCIUM 9.0 05/06/2011 0545   ALKPHOS 91 02/03/2013 1002   AST 15 02/03/2013 1002   ALT 8 02/03/2013 1002   BILITOT 0.33 02/03/2013 1002       Studies/Results: Mr Laqueta Jean WG Contrast  02/05/2013   *RADIOLOGY REPORT*  Clinical Data: Staging for high-grade B-cell lymphoma.  MRI HEAD WITHOUT AND WITH CONTRAST  Technique:  Multiplanar, multiecho pulse sequences of the brain and surrounding structures were obtained according to standard protocol without and with intravenous contrast  Contrast: 17mL MULTIHANCE GADOBENATE DIMEGLUMINE 529 MG/ML IV SOLN  Comparison: 09/11 the 2013 head CT.  12/31/2012 MR neck.  Findings: Nasopharyngeal mass greater on the right with extension into the clivus.  Opacification left mastoid air cells most likely from the left sided eustachian tube dysfunction.  No parenchymal enhancing lesion.  Symmetric normal appearance of the cavernous sinus, Meckel's cave and orbital structures.  No acute infarct.  No intracranial hemorrhage.  No hydrocephalus.  Mild to moderate small vessel disease type changes.  Major intracranial vascular structures are patent.  Mucosal thickening sphenoid sinus air cells and ethmoid sinus air cells.  IMPRESSION: Nasopharyngeal mass greater on the right with extension into  the clivus.  Opacification left mastoid air cells most likely from the left sided eustachian tube dysfunction.  No parenchymal enhancing lesion.  Mild to moderate white matter type changes probably related to result of small vessel disease in this diabetic hypertensive patient.   Original Report Authenticated By: Lacy Duverney, M.D.   Nm Pet Image Initial (pi)  Skull Base To Thigh  01/26/2013   *RADIOLOGY REPORT*  Clinical Data: Initial treatment strategy for non Hodgkin's lymphoma.  NUCLEAR MEDICINE PET SKULL BASE TO THIGH  Fasting Blood Glucose:  111  Technique:  17.7 mCi F-18 FDG was injected intravenously. CT data was obtained and used for attenuation correction and anatomic localization only.  (This was not acquired as a diagnostic CT examination.) Additional exam technical data entered on technologist worksheet.  Comparison:  None  Findings:  Neck: There is hypermetabolic mass in the posterior right oronasopharynx which crosses the midline to involve the lymphoid tissue on the left .  The activity extends inferiorly on the right to involve the palatine tonsil.   The metabolic activity of this mass is intense with SUV max = 31.6.  There are bilateral small level II lymph nodes.  One lymph node on the left measuring 6 mm (image 37) does have elevated metabolic activity.  There is symmetric activity within the salivary glands felt to be physiologic.  There is asymmetric activity in the vocal cords with the left less than the right.  There is no supraclavicular adenopathy.  Chest:  No hypermetabolic axillary or mediastinal lymph nodes.  A 5 mm nodule in the left upper lobe (image 83) which does not have associated metabolic activity.  Abdomen/Pelvis:  No abnormal activity within the spleen which is normal volume.  No hypermetabolic retroperitoneal periportal lymph nodes.  Liver is normal.  No pelvic or inguinal adenopathy or metabolic lymph nodes.  Skeleton:  No focal hypermetabolic activity to suggest skeletal metastasis.  IMPRESSION:  1.  Intensely hypermetabolic mass within the posterior naso- oropharynx extending to the right tonsillar pillar is consistent with lymphoma. 2.  Small mildly hypermetabolic level II lymph nodes are indeterminate. 3.  No evidence of supraclavicular, mediastinal, axillary or retroperitoneal adenopathy. 4.  Normal spleen. 5.  No evidence  skeletal metastasis. 6.  5 mm pulmonary nodule in the left upper lobe.  Recommend attention on follow-up. 7.  Asymmetric activity  within the vocal cords without clear explanation.   Original Report Authenticated By: Genevive Bi, M.D.   Ir Fluoro Guide Cv Line Right  02/01/2013   *RADIOLOGY REPORT*  Indication: History of non-Hodgkins lymphoma, in need of intravenous access for chemotherapy administration.  IMPLANTED PORT A CATH PLACEMENT WITH ULTRASOUND AND FLUOROSCOPIC GUIDANCE  Comparison: PET CT - 01/26/2013  Sedation: Versed 2 mg IV; Fentanyl 100 mcg IV; Ancef 2 gm IV; IV antibiotic was given in an appropriate time interval prior to skin puncture.  Total Moderate Sedation Time: 30 minutes.  Contrast: None  Fluoroscopy Time: 18 seconds  Complications: None immediate  Procedure:  The procedure, risks, benefits, and alternatives were explained to the patient.  Questions regarding the procedure were encouraged and answered.  The patient understands and consents to the procedure.  The right neck and chest were prepped with chlorhexidine in a sterile fashion, and a sterile drape was applied covering the operative field.  Maximum barrier sterile technique with sterile gowns and gloves were used for the procedure.  A timeout was performed prior to the initiation of the procedure.  Local anesthesia was provided  with 1% lidocaine with epinephrine.  After creating a small venotomy incision, a micropuncture kit was utilized to access the right internal jugular vein under direct, real-time ultrasound guidance.  Ultrasound image documentation was performed.  The microwire was kinked to measure appropriate catheter length.  A subcutaneous port pocket was then created along the upper chest wall utilizing a combination of sharp and blunt dissection.  The pocket was irrigated with sterile saline.  A single lumen ISP power injectable port was chosen for placement.  The 8 Fr catheter was tunneled from the port pocket site to  the venotomy incision.  The port was placed in the pocket.  The external catheter was trimmed to appropriate length.  At the venotomy, an 8 Fr peel-away sheath was placed over a guidewire under fluoroscopic guidance.  The catheter was then placed through the sheath and the sheath was removed.  Final catheter positioning was confirmed and documented with a fluoroscopic spot radiograph.  The port was accessed with a Huber needle, aspirated and flushed with heparinized saline.  The venotomy site was closed with an interrupted 4-0 Vicryl suture. The port pocket incision was closed with interrupted 2-0 Vicryl suture and the skin was opposed with a running subcuticular 4-0 Vicryl suture.  Dermabond and Steri-strips were applied to both incisions.  Dressings were placed.  The patient tolerated the procedure well without immediate post procedural complication.  Findings:  After catheter placement, the tip lies at the superior cavoatrial junction.  The catheter aspirates and flushes normally and is ready for immediate use.  IMPRESSION:  Successful placement of a right internal jugular approach single lumen power injectable Port-A-Cath.  The catheter is ready for immediate use.   Original Report Authenticated By: Tacey Ruiz, MD   Ir US Guide Vasc Access Right  02/01/2013   *RADIOLOGY REPORT*  Indication: History of non-Hodgkins lymphoma, in need of intravenous access for chemotherapy administration.  IMPLANTED PORT A CATH PLACEMENT WITH ULTRASOUND AND FLUOROSCOPIC GUIDANCE  Comparison: PET CT - 01/26/2013  Sedation: Versed 2 mg IV; Fentanyl 100 mcg IV; Ancef 2 gm IV; IV antibiotic was given in an appropriate time interval prior to skin puncture.  Total Moderate Sedation Time: 30 minutes.  Contrast: None  Fluoroscopy Time: 18 seconds  Complications: None immediate  Procedure:  The procedure, risks, benefits, and alternatives were explained to the patient.  Questions regarding the procedure were encouraged and answered.   The patient understands and consents to the procedure.  The right neck and chest were prepped with chlorhexidine in a sterile fashion, and a sterile drape was applied covering the operative field.  Maximum barrier sterile technique with sterile gowns and gloves were used for the procedure.  A timeout was performed prior to the initiation of the procedure.  Local anesthesia was provided with 1% lidocaine with epinephrine.  After creating a small venotomy incision, a micropuncture kit was utilized to access the right internal jugular vein under direct, real-time ultrasound guidance.  Ultrasound image documentation was performed.  The microwire was kinked to measure appropriate catheter length.  A subcutaneous port pocket was then created along the upper chest wall utilizing a combination of sharp and blunt dissection.  The pocket was irrigated with sterile saline.  A single lumen ISP power injectable port was chosen for placement.  The 8 Fr catheter was tunneled from the port pocket site to the venotomy incision.  The port was placed in the pocket.  The external catheter was trimmed to appropriate length.  At the venotomy, an 8 Fr peel-away sheath was placed over a guidewire under fluoroscopic guidance.  The catheter was then placed through the sheath and the sheath was removed.  Final catheter positioning was confirmed and documented with a fluoroscopic spot radiograph.  The port was accessed with a Huber needle, aspirated and flushed with heparinized saline.  The venotomy site was closed with an interrupted 4-0 Vicryl suture. The port pocket incision was closed with interrupted 2-0 Vicryl suture and the skin was opposed with a running subcuticular 4-0 Vicryl suture.  Dermabond and Steri-strips were applied to both incisions.  Dressings were placed.  The patient tolerated the procedure well without immediate post procedural complication.  Findings:  After catheter placement, the tip lies at the superior cavoatrial  junction.  The catheter aspirates and flushes normally and is ready for immediate use.  IMPRESSION:  Successful placement of a right internal jugular approach single lumen power injectable Port-A-Cath.  The catheter is ready for immediate use.   Original Report Authenticated By: Tacey Ruiz, MD   Dg Fluoro Guide Lumbar Puncture  02/05/2013   *RADIOLOGY REPORT*  Clinical Data: 67 year old female with high-grade B-cell lymphoma. Diagnostic lumbar puncture, including for CSF cytology, requested.  DIAGNOSTIC LUMBAR PUNCTURE UNDER FLUOROSCOPIC GUIDANCE  Technique: Informed consent was obtained from the patient prior to the procedure, including potential complications of headache, allergy, and pain.   A "time-out" was performed.  With the patient prone, the lower back was prepped and draped in the usual sterile fashion with Betadine.  1% Lidocaine was used for local anesthesia.  Using fluoroscopic guidance, lumbar puncture was performed at the L3-L4 level using right sublaminar technique.  A 3.5 inches x 20 gauge spinal needle was advanced into the thecal sac with return of clear, colorless CSF.    Opening pressure was measured to be 14 cm of water.  Fluoroscopy time of 0 minutes and 15 seconds utilized.  Approximately 23 mL of CSF were collected into 4 tubes for laboratory studies. The needle was withdrawn, direct pressure was held, and hemostases was noted.  The patient tolerated the procedure well and there were no apparent complications. The patient was given appropriate post procedural instructions and discharged to home in good condition following a period of observation.  IMPRESSION: Fluoroscopic-guided lumbar puncture at L3-L4 with collection of CSF which was transported to the lab for analysis.   Original Report Authenticated By: Erskine Speed, M.D.    Medications: I have reviewed the patient's current medications.  Assessment/Plan:  1. High-grade B-cell non-Hodgkin's lymphoma involving the nasopharynx  and level 3/4 cervical lymph nodes with evidence for local invasion into the clivus. Staging bone marrow biopsy negative for involvement with lymphoma. Cerebrospinal fluid negative as well. Status post cycle 1 CHOP/Rituxan 02/08/2013 (prednisone initiated 02/03/2013 for symptomatic relief). 2. Right ear pain. Resolved. 3. Delayed nausea following cycle 1 CHOP/Rituxan. Aloxi will be added beginning with cycle 2. 4. Constipation following cycle 1 CHOP/Rituxan. She will begin a gentle laxative one to 2 days before cycle 2.  Disposition-Ms. Pharris has completed 1 cycle of CHOP/Rituxan. The right ear pain has resolved. Aside from delayed nausea she tolerated cycle 1 well. Adjustment will be made to the premedication regimen as outlined above. She is scheduled to return for cycle 2 on 03/01/2013. She understands to begin the prednisone on day 2. She will again receive Neulasta support.  Dr. Cyndie Chime recommends proceeding with prophylactic intrathecal methotrexate. We will make a referral to interventional radiology for the  first intrathecal methotrexate on 03/02/2013.  She will return for a followup visit with Dr. Cyndie Chime on 03/15/2013. She will contact the office in the interim with any problems.  Plan reviewed with Dr. Cyndie Chime.  Marissa Clayton ANP/GNP-BC

## 2013-02-23 ENCOUNTER — Other Ambulatory Visit: Payer: Self-pay | Admitting: Oncology

## 2013-02-23 DIAGNOSIS — C8510 Unspecified B-cell lymphoma, unspecified site: Secondary | ICD-10-CM

## 2013-02-25 ENCOUNTER — Encounter (HOSPITAL_COMMUNITY): Payer: Self-pay | Admitting: Pharmacy Technician

## 2013-03-01 ENCOUNTER — Other Ambulatory Visit: Payer: Self-pay | Admitting: Oncology

## 2013-03-01 ENCOUNTER — Ambulatory Visit (HOSPITAL_BASED_OUTPATIENT_CLINIC_OR_DEPARTMENT_OTHER): Payer: Medicare Other

## 2013-03-01 VITALS — BP 111/62 | HR 87 | Temp 98.2°F | Resp 20

## 2013-03-01 DIAGNOSIS — C8510 Unspecified B-cell lymphoma, unspecified site: Secondary | ICD-10-CM

## 2013-03-01 DIAGNOSIS — Z5111 Encounter for antineoplastic chemotherapy: Secondary | ICD-10-CM

## 2013-03-01 DIAGNOSIS — Z5112 Encounter for antineoplastic immunotherapy: Secondary | ICD-10-CM

## 2013-03-01 DIAGNOSIS — C8589 Other specified types of non-Hodgkin lymphoma, extranodal and solid organ sites: Secondary | ICD-10-CM

## 2013-03-01 MED ORDER — DOXORUBICIN HCL CHEMO IV INJECTION 2 MG/ML
50.0000 mg/m2 | Freq: Once | INTRAVENOUS | Status: AC
  Administered 2013-03-01: 100 mg via INTRAVENOUS
  Filled 2013-03-01: qty 50

## 2013-03-01 MED ORDER — SODIUM CHLORIDE 0.9 % IV SOLN
750.0000 mg/m2 | Freq: Once | INTRAVENOUS | Status: AC
  Administered 2013-03-01: 1500 mg via INTRAVENOUS
  Filled 2013-03-01: qty 75

## 2013-03-01 MED ORDER — PALONOSETRON HCL INJECTION 0.25 MG/5ML
0.2500 mg | Freq: Once | INTRAVENOUS | Status: AC
  Administered 2013-03-01: 0.25 mg via INTRAVENOUS

## 2013-03-01 MED ORDER — ACETAMINOPHEN 325 MG PO TABS
650.0000 mg | ORAL_TABLET | Freq: Once | ORAL | Status: AC
  Administered 2013-03-01: 650 mg via ORAL

## 2013-03-01 MED ORDER — DIPHENHYDRAMINE HCL 25 MG PO CAPS
50.0000 mg | ORAL_CAPSULE | Freq: Once | ORAL | Status: AC
  Administered 2013-03-01: 50 mg via ORAL

## 2013-03-01 MED ORDER — VINCRISTINE SULFATE CHEMO INJECTION 1 MG/ML
2.0000 mg | Freq: Once | INTRAVENOUS | Status: AC
  Administered 2013-03-01: 2 mg via INTRAVENOUS
  Filled 2013-03-01: qty 2

## 2013-03-01 MED ORDER — SODIUM CHLORIDE 0.9 % IV SOLN
Freq: Once | INTRAVENOUS | Status: AC
  Administered 2013-03-01: 10:00:00 via INTRAVENOUS

## 2013-03-01 MED ORDER — HEPARIN SOD (PORK) LOCK FLUSH 100 UNIT/ML IV SOLN
500.0000 [IU] | Freq: Once | INTRAVENOUS | Status: AC | PRN
  Administered 2013-03-01: 500 [IU]
  Filled 2013-03-01: qty 5

## 2013-03-01 MED ORDER — SODIUM CHLORIDE 0.9 % IJ SOLN
10.0000 mL | INTRAMUSCULAR | Status: DC | PRN
  Administered 2013-03-01: 10 mL
  Filled 2013-03-01: qty 10

## 2013-03-01 MED ORDER — DEXAMETHASONE SODIUM PHOSPHATE 20 MG/5ML IJ SOLN
20.0000 mg | Freq: Once | INTRAMUSCULAR | Status: AC
  Administered 2013-03-01: 20 mg via INTRAVENOUS

## 2013-03-01 MED ORDER — SODIUM CHLORIDE 0.9 % IV SOLN
375.0000 mg/m2 | Freq: Once | INTRAVENOUS | Status: AC
  Administered 2013-03-01: 700 mg via INTRAVENOUS
  Filled 2013-03-01: qty 70

## 2013-03-01 NOTE — Patient Instructions (Addendum)
[facility] Discharge Instructions for Patients Receiving Chemotherapy  Today you received the following chemotherapy agents Rituxan, Adriamycin, Vincristine and Cytoxan.  To help prevent nausea and vomiting after your treatment, we encourage you to take your nausea medication as prescribed.   If you develop nausea and vomiting that is not controlled by your nausea medication, call the clinic.   BELOW ARE SYMPTOMS THAT SHOULD BE REPORTED IMMEDIATELY:  *FEVER GREATER THAN 100.5 F  *CHILLS WITH OR WITHOUT FEVER  NAUSEA AND VOMITING THAT IS NOT CONTROLLED WITH YOUR NAUSEA MEDICATION  *UNUSUAL SHORTNESS OF BREATH  *UNUSUAL BRUISING OR BLEEDING  TENDERNESS IN MOUTH AND THROAT WITH OR WITHOUT PRESENCE OF ULCERS  *URINARY PROBLEMS  *BOWEL PROBLEMS  UNUSUAL RASH Items with * indicate a potential emergency and should be followed up as soon as possible.  Feel free to call the clinic you have any questions or concerns. The clinic phone number is [phone].    

## 2013-03-02 ENCOUNTER — Ambulatory Visit (HOSPITAL_COMMUNITY)
Admission: RE | Admit: 2013-03-02 | Discharge: 2013-03-02 | Disposition: A | Discharge status: Home / Self Care | Payer: Medicare Other | Source: Ambulatory Visit | Attending: Oncology | Admitting: Oncology

## 2013-03-02 ENCOUNTER — Ambulatory Visit (HOSPITAL_COMMUNITY)
Admission: RE | Admit: 2013-03-02 | Discharge: 2013-03-02 | Disposition: A | Discharge status: Home / Self Care | Payer: Medicare Other | Source: Ambulatory Visit | Attending: Nurse Practitioner | Admitting: Nurse Practitioner

## 2013-03-02 ENCOUNTER — Other Ambulatory Visit (HOSPITAL_COMMUNITY): Payer: Self-pay | Admitting: Radiology

## 2013-03-02 ENCOUNTER — Inpatient Hospital Stay: Payer: Medicare Other

## 2013-03-02 ENCOUNTER — Ambulatory Visit: Payer: Medicare Other

## 2013-03-02 ENCOUNTER — Ambulatory Visit (HOSPITAL_BASED_OUTPATIENT_CLINIC_OR_DEPARTMENT_OTHER): Payer: Medicare Other | Admitting: Nurse Practitioner

## 2013-03-02 ENCOUNTER — Ambulatory Visit: Payer: Medicare Other | Admitting: Oncology

## 2013-03-02 ENCOUNTER — Other Ambulatory Visit: Payer: Self-pay | Admitting: *Deleted

## 2013-03-02 VITALS — BP 128/78 | HR 84 | Temp 96.8°F | Resp 18

## 2013-03-02 VITALS — BP 111/70 | HR 87 | Temp 97.3°F | Resp 18

## 2013-03-02 DIAGNOSIS — R112 Nausea with vomiting, unspecified: Secondary | ICD-10-CM

## 2013-03-02 DIAGNOSIS — Z79899 Other long term (current) drug therapy: Secondary | ICD-10-CM | POA: Insufficient documentation

## 2013-03-02 DIAGNOSIS — C8510 Unspecified B-cell lymphoma, unspecified site: Secondary | ICD-10-CM

## 2013-03-02 DIAGNOSIS — C8589 Other specified types of non-Hodgkin lymphoma, extranodal and solid organ sites: Secondary | ICD-10-CM | POA: Insufficient documentation

## 2013-03-02 LAB — GLUCOSE, CSF: Glucose, CSF: 95 mg/dL — ABNORMAL HIGH (ref 43–76)

## 2013-03-02 LAB — PROTEIN, CSF: Total  Protein, CSF: 30 mg/dL (ref 15–45)

## 2013-03-02 LAB — CSF CELL COUNT WITH DIFFERENTIAL
RBC Count, CSF: 482 /mm3 — ABNORMAL HIGH
Tube #: 1
WBC, CSF: 1 /mm3 (ref 0–5)

## 2013-03-02 LAB — LACTATE DEHYDROGENASE, PLEURAL OR PERITONEAL FLUID: LD, Fluid: 20 U/L (ref 3–23)

## 2013-03-02 LAB — GRAM STAIN

## 2013-03-02 MED ORDER — PROCHLORPERAZINE MALEATE 10 MG PO TABS
10.0000 mg | ORAL_TABLET | ORAL | Status: DC
  Filled 2013-03-02: qty 1

## 2013-03-02 MED ORDER — SODIUM CHLORIDE 0.9 % IV SOLN
Freq: Once | INTRAVENOUS | Status: AC
  Administered 2013-03-02: 13:00:00 via INTRAVENOUS

## 2013-03-02 MED ORDER — ACETAMINOPHEN 325 MG PO TABS: 650.0000 mg | ORAL_TABLET | ORAL | Status: DC | PRN

## 2013-03-02 MED ORDER — ACETAMINOPHEN 325 MG PO TABS
650.0000 mg | ORAL_TABLET | ORAL | Status: DC | PRN
  Filled 2013-03-02: qty 2

## 2013-03-02 MED ORDER — HEPARIN SOD (PORK) LOCK FLUSH 100 UNIT/ML IV SOLN
INTRAVENOUS | Status: AC
  Filled 2013-03-02: qty 5

## 2013-03-02 MED ORDER — SODIUM CHLORIDE 0.9 % IJ SOLN: 10.0000 mL | INTRAMUSCULAR | Status: DC | PRN

## 2013-03-02 MED ORDER — HEPARIN SOD (PORK) LOCK FLUSH 100 UNIT/ML IV SOLN
500.0000 [IU] | INTRAVENOUS | Status: AC | PRN
  Administered 2013-03-02: 500 [IU]

## 2013-03-02 MED ORDER — SODIUM CHLORIDE 0.9 % IV SOLN
150.0000 mg | Freq: Once | INTRAVENOUS | Status: AC
  Administered 2013-03-02: 150 mg via INTRAVENOUS
  Filled 2013-03-02: qty 5

## 2013-03-02 MED ORDER — METOCLOPRAMIDE HCL 5 MG/ML IJ SOLN
10.0000 mg | INTRAMUSCULAR | Status: AC
  Administered 2013-03-02: 10 mg via INTRAVENOUS
  Filled 2013-03-02 (×2): qty 2

## 2013-03-02 MED ORDER — SODIUM CHLORIDE 0.9 % IJ SOLN
Freq: Once | INTRAMUSCULAR | Status: AC
  Administered 2013-03-02: 10:00:00 via INTRATHECAL
  Filled 2013-03-02: qty 0.48

## 2013-03-02 MED ORDER — PROCHLORPERAZINE MALEATE 10 MG PO TABS: 10.0000 mg | ORAL_TABLET | Freq: Three times a day (TID) | ORAL | Status: DC | PRN

## 2013-03-02 MED ORDER — SODIUM CHLORIDE 0.9 % IV SOLN
INTRAVENOUS | Status: DC
  Administered 2013-03-02: 16:00:00 via INTRAVENOUS

## 2013-03-02 NOTE — Progress Notes (Signed)
Pt returned from radiology post intrathecal chemo for recovery. Pt is to be on bedrest until 1448. She states that she has an appointment for an injection at the cancer center for 1330 today. Called cancer center and it is OK for her to come for her appointment after discharge from short stay around 1500.

## 2013-03-02 NOTE — Progress Notes (Signed)
Patient vomited small amount of clear liquid. Lonna Cobb NP telephoned and was told about patient's condition. No orders received at this time. Will continue monitor patient.

## 2013-03-02 NOTE — Progress Notes (Signed)
Spoke with Dr Eppie Gibson in Radiology  And he would like to have [place]  call Dr Cyndie Chime and Lonna Cobb for antiemetic post intrathecal chemo

## 2013-03-02 NOTE — Progress Notes (Addendum)
Orders received for po Compazine but patient has again vomited 30 cc clear liquid and unable to take po med . Placed another call for additional medication for vomiting

## 2013-03-02 NOTE — Progress Notes (Addendum)
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 Staging bone marrow biopsy on 02/05/2013 was slightly hypercellular with trilineage hematopoiesis and no evidence for involvement with lymphoma. A lumbar puncture was done on on 02/05/2013. The cerebrospinal fluid showed no malignant cells. She completed cycle #1 CHOP/Rituxan beginning 02/08/2013 (she began the prednisone portion of the treatment on 02/03/2013 for symptomatic relief). She completed cycle 2 on 03/01/2013.  She underwent the first prophylactic intrathecal methotrexate earlier today in interventional radiology. She developed acute nausea/vomiting requiring multiple different anti-emetics. The nausea eventually responded to Emend. She spent the majority of the day in the Medical Day Department.   The nausea is significantly better. She denies any headaches. She feels somewhat dizzy. No diplopia. No focal weakness. No numbness. She describes feeling "funny".   Objective: Blood pressure 128/78, pulse 84, temperature 96.8 F (36 C), temperature source Oral, resp. rate 18.  Pupils equal round and reactive to light. Extraocular movements intact. Oropharynx is without thrush or ulceration. Lungs are clear. Regular cardiac rhythm. Port-A-Cath site is without erythema. Abdomen is soft and nontender. Bowel sounds active. Extremities are without edema. Motor strength 5 over 5. Knee DTRs 1+, symmetric. Finger to nose and rapid alternating movement intact. She is alert, oriented and appropriate.  Lab Results: Lab Results  Component Value Date   WBC 3.6* 02/19/2013   HGB 12.1 02/19/2013   HCT 35.2 02/19/2013   MCV 83.6 02/19/2013   PLT 196 02/19/2013    Chemistry:    Chemistry      Component Value Date/Time   NA 139 02/19/2013 1202   NA 143  05/06/2011 0545   K 3.8 02/19/2013 1202   K 3.5 05/06/2011 0545   CL 103 02/03/2013 1002   CL 105 05/06/2011 0545   CO2 29 02/19/2013 1202   CO2 31 05/06/2011 0545   BUN 9.3 02/19/2013 1202   BUN 16 05/06/2011 0545   CREATININE 1.0 02/19/2013 1202   CREATININE 0.83 05/06/2011 0545      Component Value Date/Time   CALCIUM 9.8 02/19/2013 1202   CALCIUM 9.0 05/06/2011 0545   ALKPHOS 102 02/19/2013 1202   AST 33 02/19/2013 1202   ALT 36 02/19/2013 1202   BILITOT 0.31 02/19/2013 1202       Studies/Results: Mr Laqueta Jean ZO Contrast  02/05/2013   *RADIOLOGY REPORT*  Clinical Data: Staging for high-grade B-cell lymphoma.  MRI HEAD WITHOUT AND WITH CONTRAST  Technique:  Multiplanar, multiecho pulse sequences of the brain and surrounding structures were obtained according to standard protocol without and with intravenous contrast  Contrast: 17mL MULTIHANCE GADOBENATE DIMEGLUMINE 529 MG/ML IV SOLN  Comparison: 09/11 the 2013 head CT.  12/31/2012 MR neck.  Findings: Nasopharyngeal mass greater on the right with extension into the clivus.  Opacification left mastoid air cells most likely from the left sided eustachian tube dysfunction.  No parenchymal enhancing lesion.  Symmetric normal appearance of the cavernous sinus, Meckel's cave and orbital structures.  No acute infarct.  No intracranial hemorrhage.  No hydrocephalus.  Mild to moderate small vessel disease type changes.  Major intracranial vascular structures are patent.  Mucosal thickening sphenoid sinus air cells and ethmoid sinus air cells.  IMPRESSION: Nasopharyngeal mass greater on the right with extension into the clivus.  Opacification left mastoid air cells most likely from the left sided  eustachian tube dysfunction.  No parenchymal enhancing lesion.  Mild to moderate white matter type changes probably related to result of small vessel disease in this diabetic hypertensive patient.   Original Report Authenticated By: Lacy Duverney, M.D.   Ir Fluoro Guide Cv  Line Right  02/01/2013   *RADIOLOGY REPORT*  Indication: History of non-Hodgkins lymphoma, in need of intravenous access for chemotherapy administration.  IMPLANTED PORT A CATH PLACEMENT WITH ULTRASOUND AND FLUOROSCOPIC GUIDANCE  Comparison: PET CT - 01/26/2013  Sedation: Versed 2 mg IV; Fentanyl 100 mcg IV; Ancef 2 gm IV; IV antibiotic was given in an appropriate time interval prior to skin puncture.  Total Moderate Sedation Time: 30 minutes.  Contrast: None  Fluoroscopy Time: 18 seconds  Complications: None immediate  Procedure:  The procedure, risks, benefits, and alternatives were explained to the patient.  Questions regarding the procedure were encouraged and answered.  The patient understands and consents to the procedure.  The right neck and chest were prepped with chlorhexidine in a sterile fashion, and a sterile drape was applied covering the operative field.  Maximum barrier sterile technique with sterile gowns and gloves were used for the procedure.  A timeout was performed prior to the initiation of the procedure.  Local anesthesia was provided with 1% lidocaine with epinephrine.  After creating a small venotomy incision, a micropuncture kit was utilized to access the right internal jugular vein under direct, real-time ultrasound guidance.  Ultrasound image documentation was performed.  The microwire was kinked to measure appropriate catheter length.  A subcutaneous port pocket was then created along the upper chest wall utilizing a combination of sharp and blunt dissection.  The pocket was irrigated with sterile saline.  A single lumen ISP power injectable port was chosen for placement.  The 8 Fr catheter was tunneled from the port pocket site to the venotomy incision.  The port was placed in the pocket.  The external catheter was trimmed to appropriate length.  At the venotomy, an 8 Fr peel-away sheath was placed over a guidewire under fluoroscopic guidance.  The catheter was then placed through the  sheath and the sheath was removed.  Final catheter positioning was confirmed and documented with a fluoroscopic spot radiograph.  The port was accessed with a Huber needle, aspirated and flushed with heparinized saline.  The venotomy site was closed with an interrupted 4-0 Vicryl suture. The port pocket incision was closed with interrupted 2-0 Vicryl suture and the skin was opposed with a running subcuticular 4-0 Vicryl suture.  Dermabond and Steri-strips were applied to both incisions.  Dressings were placed.  The patient tolerated the procedure well without immediate post procedural complication.  Findings:  After catheter placement, the tip lies at the superior cavoatrial junction.  The catheter aspirates and flushes normally and is ready for immediate use.  IMPRESSION:  Successful placement of a right internal jugular approach single lumen power injectable Port-A-Cath.  The catheter is ready for immediate use.   Original Report Authenticated By: Tacey Ruiz, MD   Ir US Guide Vasc Access Right  02/01/2013   *RADIOLOGY REPORT*  Indication: History of non-Hodgkins lymphoma, in need of intravenous access for chemotherapy administration.  IMPLANTED PORT A CATH PLACEMENT WITH ULTRASOUND AND FLUOROSCOPIC GUIDANCE  Comparison: PET CT - 01/26/2013  Sedation: Versed 2 mg IV; Fentanyl 100 mcg IV; Ancef 2 gm IV; IV antibiotic was given in an appropriate time interval prior to skin puncture.  Total Moderate Sedation Time: 30 minutes.  Contrast: None  Fluoroscopy Time: 18 seconds  Complications: None immediate  Procedure:  The procedure, risks, benefits, and alternatives were explained to the patient.  Questions regarding the procedure were encouraged and answered.  The patient understands and consents to the procedure.  The right neck and chest were prepped with chlorhexidine in a sterile fashion, and a sterile drape was applied covering the operative field.  Maximum barrier sterile technique with sterile gowns and gloves  were used for the procedure.  A timeout was performed prior to the initiation of the procedure.  Local anesthesia was provided with 1% lidocaine with epinephrine.  After creating a small venotomy incision, a micropuncture kit was utilized to access the right internal jugular vein under direct, real-time ultrasound guidance.  Ultrasound image documentation was performed.  The microwire was kinked to measure appropriate catheter length.  A subcutaneous port pocket was then created along the upper chest wall utilizing a combination of sharp and blunt dissection.  The pocket was irrigated with sterile saline.  A single lumen ISP power injectable port was chosen for placement.  The 8 Fr catheter was tunneled from the port pocket site to the venotomy incision.  The port was placed in the pocket.  The external catheter was trimmed to appropriate length.  At the venotomy, an 8 Fr peel-away sheath was placed over a guidewire under fluoroscopic guidance.  The catheter was then placed through the sheath and the sheath was removed.  Final catheter positioning was confirmed and documented with a fluoroscopic spot radiograph.  The port was accessed with a Huber needle, aspirated and flushed with heparinized saline.  The venotomy site was closed with an interrupted 4-0 Vicryl suture. The port pocket incision was closed with interrupted 2-0 Vicryl suture and the skin was opposed with a running subcuticular 4-0 Vicryl suture.  Dermabond and Steri-strips were applied to both incisions.  Dressings were placed.  The patient tolerated the procedure well without immediate post procedural complication.  Findings:  After catheter placement, the tip lies at the superior cavoatrial junction.  The catheter aspirates and flushes normally and is ready for immediate use.  IMPRESSION:  Successful placement of a right internal jugular approach single lumen power injectable Port-A-Cath.  The catheter is ready for immediate use.   Original Report  Authenticated By: Tacey Ruiz, MD   Dg Fluoro Guide Spinal/si Jt Inj Left  03/02/2013   *RADIOLOGY REPORT*  Clinical Data:  High-grade non-Hodgkins lymphoma.  FLUOROSCOPICALLY GUIDED LUMBAR PUNCTURE FOR INTRATHECAL CHEMOTHERAPY  Fluoroscopy time:  0 minutes 50 seconds  Technique:  Informed consent was obtained from the patient prior to the procedure, including potential complications of headache, allergy, and pain.   With the patient prone, the lower back was prepped with Betadine.  1% Lidocaine was used for local anesthesia. Lumbar puncture was performed at the L2-3 level using a 20 gauge needle with return of clear CSF. 12 ml of CSF was obtained in 4 sterile collection vials and sent for laboratory evaluation as ordered by the referring physician.  10 ml  of  methotrexate was subsequently injected into the subarachnoid space. The spinal needle was removed, and a sterile and shows applied to the skin puncture site.  The patient was placed in supine position which have been flat on stretcher, and sent to short stay unit for observation.  The patient tolerated the procedure well without apparent complication.  IMPRESSION: Successful diagnostic and therapeutic lumbar puncture for intrathecal chemotherapy, as described above.  No evidence of immediate complication.  Original Report Authenticated By: Myles Rosenthal, M.D.   Dg Fluoro Guide Lumbar Puncture  02/05/2013   *RADIOLOGY REPORT*  Clinical Data: 67 year old female with high-grade B-cell lymphoma. Diagnostic lumbar puncture, including for CSF cytology, requested.  DIAGNOSTIC LUMBAR PUNCTURE UNDER FLUOROSCOPIC GUIDANCE  Technique: Informed consent was obtained from the patient prior to the procedure, including potential complications of headache, allergy, and pain.   A "time-out" was performed.  With the patient prone, the lower back was prepped and draped in the usual sterile fashion with Betadine.  1% Lidocaine was used for local anesthesia.  Using  fluoroscopic guidance, lumbar puncture was performed at the L3-L4 level using right sublaminar technique.  A 3.5 inches x 20 gauge spinal needle was advanced into the thecal sac with return of clear, colorless CSF.    Opening pressure was measured to be 14 cm of water.  Fluoroscopy time of 0 minutes and 15 seconds utilized.  Approximately 23 mL of CSF were collected into 4 tubes for laboratory studies. The needle was withdrawn, direct pressure was held, and hemostases was noted.  The patient tolerated the procedure well and there were no apparent complications. The patient was given appropriate post procedural instructions and discharged to home in good condition following a period of observation.  IMPRESSION: Fluoroscopic-guided lumbar puncture at L3-L4 with collection of CSF which was transported to the lab for analysis.   Original Report Authenticated By: Erskine Speed, M.D.    Medications: I have reviewed the patient's current medications.  Assessment/Plan:  1. Acute nausea/vomiting following prophylactic intrathecal methotrexate. 2. High-grade B-cell non-Hodgkin's lymphoma involving the nasopharynx and level 3/4 cervical lymph nodes with evidence for local invasion into the clivus. Staging bone marrow biopsy negative for involvement with lymphoma. Cerebrospinal fluid negative as well. Status post cycle 1 CHOP/Rituxan 02/08/2013 (prednisone initiated 02/03/2013 for symptomatic relief). Status post cycle 2 CHOP/Rituxan 03/01/2013.  Disposition-she developed acute nausea/vomiting following the lumbar puncture/administration of intrathecal methotrexate. We feel the nausea/vomiting was most likely related to the intrathecal treatment rather than delayed nausea from the recent chemotherapy. We will add antiemetic premedication to the next intrathecal treatment. She appears stable for discharge home with her son.  We will arrange to have the Neulasta injection administered at the [facility] cancer  Center on 03/03/2013.  She will followup as scheduled. She knows to contact the office prior to that visit with further problems.  Patient was seen with Dr. Cyndie Chime.  Marissa Clayton ANP/GNP-BC   Attending hematology: I personally interviewed and examined this patient. 67 year old woman recently diagnosed with a high-grade B-cell non-Hodgkin's lymphoma involving her nasopharynx and cervical lymph nodes. She started therapy with CHOP Rituxan in 3 weeks ago and tolerated this well. In view of her high risk for central nervous system progression, intrathecal chemotherapy was initiated today. She has developed refractory nausea and vomiting and then extreme anxiety. We received a number of phone calls from the outpatient day care center where she was transported after the instillation of the intrathecal chemotherapy. We managed her nausea and vomiting with a number of antiemetics. We dressed it with her to our center for closer observation. At this point the vomiting has stopped and the nausea has subsided. She has no focal abnormalities on exam. I feel she is stable for discharge home in the company of her family. The antiemetic that seemed to make the difference today was a dose of parenteral aprepitant. We'll add this to the premedication prior to her next LP. I am going to  attempt to get for total doses of intrathecal chemotherapy integrated into her other chemotherapy.   Levert Feinstein, MD 07/11/20143:26 PM

## 2013-03-02 NOTE — Progress Notes (Signed)
Pt again vomited 50 cc clear liquid Spoke again with Kaleen Odea NP and received orders for IV Reglan. PAC was accessed and Regan given IV .

## 2013-03-02 NOTE — Progress Notes (Signed)
Patient is accompanied by nursing staff to Cancer Center at 1620. Patient nausea is decreased. Patient is complaining of dizziness, headache and "I feel sick". Patient's son is with patient at discharge. Patient's vital signs are stable. Patient ambulated to the bathroom. Patient had bowel movement. Patient and son is in agreement to go to the Cancer Center from Short Stay. Report given to [facility].

## 2013-03-02 NOTE — Progress Notes (Signed)
Recovery post intrathecal chemo. Pt became nauseated . Assisted to right side and pt vomited approx 500 clear liquid in basin. Beeped Lonna Cobb NP for something for this

## 2013-03-03 ENCOUNTER — Telehealth: Payer: Self-pay | Admitting: *Deleted

## 2013-03-03 DIAGNOSIS — D702 Other drug-induced agranulocytosis: Secondary | ICD-10-CM

## 2013-03-03 NOTE — Telephone Encounter (Signed)
Called pt & informed that neulasta set up for 3 pm today at [facility].  Orders faxed to [facility] @ [phone] & also called Malachi Bonds to schedule.

## 2013-03-05 ENCOUNTER — Encounter: Payer: Self-pay | Admitting: Nurse Practitioner

## 2013-03-05 DIAGNOSIS — R112 Nausea with vomiting, unspecified: Secondary | ICD-10-CM | POA: Insufficient documentation

## 2013-03-05 HISTORY — DX: Nausea with vomiting, unspecified: R11.2

## 2013-03-15 ENCOUNTER — Ambulatory Visit (HOSPITAL_BASED_OUTPATIENT_CLINIC_OR_DEPARTMENT_OTHER): Payer: Medicare Other | Admitting: Oncology

## 2013-03-15 ENCOUNTER — Ambulatory Visit (HOSPITAL_BASED_OUTPATIENT_CLINIC_OR_DEPARTMENT_OTHER): Payer: Medicare Other | Admitting: Lab

## 2013-03-15 ENCOUNTER — Telehealth: Payer: Self-pay | Admitting: Oncology

## 2013-03-15 VITALS — BP 132/78 | HR 79 | Temp 97.2°F | Resp 20 | Ht 67.0 in | Wt 178.4 lb

## 2013-03-15 DIAGNOSIS — C8589 Other specified types of non-Hodgkin lymphoma, extranodal and solid organ sites: Secondary | ICD-10-CM

## 2013-03-15 DIAGNOSIS — R112 Nausea with vomiting, unspecified: Secondary | ICD-10-CM

## 2013-03-15 DIAGNOSIS — C8510 Unspecified B-cell lymphoma, unspecified site: Secondary | ICD-10-CM

## 2013-03-15 LAB — CBC WITH DIFFERENTIAL/PLATELET
BASO%: 0.7 % (ref 0.0–2.0)
EOS%: 2.4 % (ref 0.0–7.0)
HCT: 35.5 % (ref 34.8–46.6)
MCH: 29.5 pg (ref 25.1–34.0)
MCHC: 34.3 g/dL (ref 31.5–36.0)
NEUT%: 76.7 % (ref 38.4–76.8)
RBC: 4.12 10*6/uL (ref 3.70–5.45)
WBC: 6.2 10*3/uL (ref 3.9–10.3)
lymph#: 0.9 10*3/uL (ref 0.9–3.3)

## 2013-03-15 LAB — COMPREHENSIVE METABOLIC PANEL (CC13)
ALT: 17 U/L (ref 0–55)
AST: 15 U/L (ref 5–34)
Calcium: 9.8 mg/dL (ref 8.4–10.4)
Chloride: 103 mEq/L (ref 98–109)
Creatinine: 0.9 mg/dL (ref 0.6–1.1)
Sodium: 143 mEq/L (ref 136–145)
Total Bilirubin: 0.32 mg/dL (ref 0.20–1.20)
Total Protein: 6.9 g/dL (ref 6.4–8.3)

## 2013-03-15 NOTE — Progress Notes (Signed)
Hematology and Oncology Follow Up Visit  Marissa Clayton [id number] Mar 07, 1946 67 y.o. 03/15/2013 8:16 PM   Principle Diagnosis: Encounter Diagnoses  Name Primary?  . High grade B-cell lymphoma Yes  . Nausea with vomiting      Interim History:   Followup visit for this 67 year old woman recently diagnosed with high-grade, B-cell, non-Hodgkin's lymphoma presenting with a one-year history of indolent onset of right ear pain and progressive nasal congestion with findings of a large soft tissue mass in the posterior, superior, nasopharynx. Please see previous notes for full details. She began chemotherapy with CHOP Rituxan on June 16. Beginning with the second cycle, intrathecal methotrexate 12 mg with hydrocortisone 100 mg added to the regimen for central nervous system prophylaxis in view of the location of her lymphoma. She developed refractory nausea and vomiting following the lumbar puncture on July 8. We gave her multiple antiemetics and evaluated her in our office. Things eventually settled down and she went home. No problems since that time. She has had some dry eyes and a minimal soreness of her mouth from the chemotherapy. She is still getting some mild frontal headache but congestion and ear pain resolved very promptly with initial 5 days of steroids given prior to starting the chemotherapy  She informs me that her husband is critically ill and just get out of the hospital. He spent time on a ventilator. He is now somewhat delirious and very demanding and will not let her out of his sight.  Medications: reviewed  Allergies: No Known Allergies  Review of Systems: Constitutional:   No interim fever or infection Respiratory: No dyspnea, no cough Cardiovascular:  No chest pain or palpitations Gastrointestinal: No abdominal pain or change in bowel habit Genito-Urinary: No urinary tract symptoms Musculoskeletal: No muscle bone or joint pain Neurologic: See above Skin: No rash or  ecchymosis Remaining ROS negative.  Physical Exam: Blood pressure 132/78, pulse 79, temperature 97.2 F (36.2 C), temperature source Oral, resp. rate 20, height 5\' 7"  (1.702 m), weight 178 lb 6.4 oz (80.922 kg). Wt Readings from Last 3 Encounters:  03/15/13 178 lb 6.4 oz (80.922 kg)  03/02/13 168 lb (76.204 kg)  02/19/13 178 lb 11.2 oz (81.058 kg)     General appearance: Well-nourished Caucasian woman. There is now total alopecia from chemotherapy. HENNT: Pharynx erythema, exudate, or ulcer. No thyromegaly. Lymph nodes: No cervical, supraclavicular, or axillary adenopathy Breasts: Lungs: Clear to auscultation resonant to percussion Heart: Regular rhythm no murmur Abdomen: Soft, nontender, no mass, no organomegaly Extremities: No edema, no calf tenderness Musculoskeletal: No joint deformities GU: Vascular: No carotid bruits, no cyanosis Neurologic: Mental status intact, PERRLA, optic disc sharp and vessels normal, strength 5 over 5, reflexes 1+ symmetric, sensation intact to vibration over the fingertips Skin: No rash or ecchymosis  Lab Results: Lab Results  Component Value Date   WBC 6.2 03/15/2013   HGB 12.2 03/15/2013   HCT 35.5 03/15/2013   MCV 86.2 03/15/2013   PLT 134* 03/15/2013     Chemistry      Component Value Date/Time   NA 143 03/15/2013 1209   NA 143 05/06/2011 0545   K 4.3 03/15/2013 1209   K 3.5 05/06/2011 0545   CL 103 02/03/2013 1002   CL 105 05/06/2011 0545   CO2 28 03/15/2013 1209   CO2 31 05/06/2011 0545   BUN 12.8 03/15/2013 1209   BUN 16 05/06/2011 0545   CREATININE 0.9 03/15/2013 1209   CREATININE 0.83 05/06/2011 0545  Component Value Date/Time   CALCIUM 9.8 03/15/2013 1209   CALCIUM 9.0 05/06/2011 0545   ALKPHOS 78 03/15/2013 1209   AST 15 03/15/2013 1209   ALT 17 03/15/2013 1209   BILITOT 0.32 03/15/2013 1209       Radiological Studies: Dg Fluoro Guide Spinal/si Jt Inj Left  03/02/2013   *RADIOLOGY REPORT*  Clinical Data:  High-grade non-Hodgkins  lymphoma.  FLUOROSCOPICALLY GUIDED LUMBAR PUNCTURE FOR INTRATHECAL CHEMOTHERAPY  Fluoroscopy time:  0 minutes 50 seconds  Technique:  Informed consent was obtained from the patient prior to the procedure, including potential complications of headache, allergy, and pain.   With the patient prone, the lower back was prepped with Betadine.  1% Lidocaine was used for local anesthesia. Lumbar puncture was performed at the L2-3 level using a 20 gauge needle with return of clear CSF. 12 ml of CSF was obtained in 4 sterile collection vials and sent for laboratory evaluation as ordered by the referring physician.  10 ml  of  methotrexate was subsequently injected into the subarachnoid space. The spinal needle was removed, and a sterile and shows applied to the skin puncture site.  The patient was placed in supine position which have been flat on stretcher, and sent to short stay unit for observation.  The patient tolerated the procedure well without apparent complication.  IMPRESSION: Successful diagnostic and therapeutic lumbar puncture for intrathecal chemotherapy, as described above.  No evidence of immediate complication.   Original Report Authenticated By: Myles Rosenthal, M.D.    Impression: #1. High-grade B cell non-Hodgkin's lymphoma of the nasopharynx Plan: She will receive her third of 6 planned cycles of CHOP Rituxan beginning on July 18. She will receive her second of 4 planned doses of intrathecal methotrexate with hydrocortisone on July 19. I will add additional antiemetics to try to prevent the significant nausea and vomiting she had with the first dose of the intrathecal drugs. I would agree restaging CT scan of her sinuses one week prior to beginning cycle #4.  #2. Hyperemesis following intrathecal methotrexate. Plan-see above #1     CC:[name]    Levert Feinstein, MD 7/21/20148:16 PM

## 2013-03-15 NOTE — Telephone Encounter (Signed)
Gave pt appt for MD , lab, chemo scan and injections for July and August 2014

## 2013-03-18 ENCOUNTER — Other Ambulatory Visit: Payer: Self-pay | Admitting: Oncology

## 2013-03-18 ENCOUNTER — Other Ambulatory Visit: Payer: Self-pay | Admitting: Pharmacist

## 2013-03-18 DIAGNOSIS — C8510 Unspecified B-cell lymphoma, unspecified site: Secondary | ICD-10-CM

## 2013-03-18 DIAGNOSIS — R112 Nausea with vomiting, unspecified: Secondary | ICD-10-CM

## 2013-03-18 MED ORDER — SODIUM CHLORIDE 0.9 % IJ SOLN: Freq: Once | INTRAMUSCULAR | Status: DC

## 2013-03-19 ENCOUNTER — Encounter (HOSPITAL_COMMUNITY): Payer: Self-pay | Admitting: Pharmacy Technician

## 2013-03-22 ENCOUNTER — Other Ambulatory Visit: Payer: Self-pay | Admitting: Oncology

## 2013-03-22 ENCOUNTER — Ambulatory Visit (HOSPITAL_BASED_OUTPATIENT_CLINIC_OR_DEPARTMENT_OTHER): Payer: Medicare Other | Admitting: Lab

## 2013-03-22 ENCOUNTER — Ambulatory Visit (HOSPITAL_BASED_OUTPATIENT_CLINIC_OR_DEPARTMENT_OTHER): Payer: Medicare Other

## 2013-03-22 VITALS — BP 106/68 | HR 82 | Temp 96.9°F | Resp 18

## 2013-03-22 DIAGNOSIS — Z5111 Encounter for antineoplastic chemotherapy: Secondary | ICD-10-CM

## 2013-03-22 DIAGNOSIS — Z5112 Encounter for antineoplastic immunotherapy: Secondary | ICD-10-CM

## 2013-03-22 DIAGNOSIS — C8510 Unspecified B-cell lymphoma, unspecified site: Secondary | ICD-10-CM

## 2013-03-22 DIAGNOSIS — C8589 Other specified types of non-Hodgkin lymphoma, extranodal and solid organ sites: Secondary | ICD-10-CM

## 2013-03-22 LAB — COMPREHENSIVE METABOLIC PANEL (CC13)
ALT: 16 U/L (ref 0–55)
AST: 21 U/L (ref 5–34)
Alkaline Phosphatase: 67 U/L (ref 40–150)
CO2: 25 mEq/L (ref 22–29)
Creatinine: 0.8 mg/dL (ref 0.6–1.1)
Sodium: 141 mEq/L (ref 136–145)
Total Bilirubin: 0.4 mg/dL (ref 0.20–1.20)
Total Protein: 6.7 g/dL (ref 6.4–8.3)

## 2013-03-22 LAB — CBC WITH DIFFERENTIAL/PLATELET
BASO%: 1.1 % (ref 0.0–2.0)
EOS%: 0.6 % (ref 0.0–7.0)
LYMPH%: 18.5 % (ref 14.0–49.7)
MCH: 28.9 pg (ref 25.1–34.0)
MCHC: 33.7 g/dL (ref 31.5–36.0)
MONO#: 0.5 10*3/uL (ref 0.1–0.9)
Platelets: 282 10*3/uL (ref 145–400)
RBC: 4.05 10*6/uL (ref 3.70–5.45)
WBC: 6.5 10*3/uL (ref 3.9–10.3)

## 2013-03-22 LAB — LACTATE DEHYDROGENASE (CC13): LDH: 180 U/L (ref 125–245)

## 2013-03-22 MED ORDER — DEXAMETHASONE SODIUM PHOSPHATE 20 MG/5ML IJ SOLN
20.0000 mg | Freq: Once | INTRAMUSCULAR | Status: AC
  Administered 2013-03-22: 20 mg via INTRAVENOUS

## 2013-03-22 MED ORDER — SODIUM CHLORIDE 0.9 % IJ SOLN
10.0000 mL | INTRAMUSCULAR | Status: DC | PRN
  Administered 2013-03-22: 10 mL
  Filled 2013-03-22: qty 10

## 2013-03-22 MED ORDER — PALONOSETRON HCL INJECTION 0.25 MG/5ML
0.2500 mg | Freq: Once | INTRAVENOUS | Status: AC
  Administered 2013-03-22: 0.25 mg via INTRAVENOUS

## 2013-03-22 MED ORDER — SODIUM CHLORIDE 0.9 % IV SOLN: Freq: Once | INTRAVENOUS | Status: DC

## 2013-03-22 MED ORDER — DIPHENHYDRAMINE HCL 25 MG PO CAPS
50.0000 mg | ORAL_CAPSULE | Freq: Once | ORAL | Status: AC
  Administered 2013-03-22: 50 mg via ORAL

## 2013-03-22 MED ORDER — SODIUM CHLORIDE 0.9 % IV SOLN
375.0000 mg/m2 | Freq: Once | INTRAVENOUS | Status: AC
  Administered 2013-03-22: 700 mg via INTRAVENOUS
  Filled 2013-03-22: qty 70

## 2013-03-22 MED ORDER — SODIUM CHLORIDE 0.9 % IV SOLN
750.0000 mg/m2 | Freq: Once | INTRAVENOUS | Status: AC
  Administered 2013-03-22: 1500 mg via INTRAVENOUS
  Filled 2013-03-22: qty 75

## 2013-03-22 MED ORDER — VINCRISTINE SULFATE CHEMO INJECTION 1 MG/ML
2.0000 mg | Freq: Once | INTRAVENOUS | Status: AC
  Administered 2013-03-22: 2 mg via INTRAVENOUS
  Filled 2013-03-22: qty 2

## 2013-03-22 MED ORDER — DOXORUBICIN HCL CHEMO IV INJECTION 2 MG/ML
50.0000 mg/m2 | Freq: Once | INTRAVENOUS | Status: AC
  Administered 2013-03-22: 100 mg via INTRAVENOUS
  Filled 2013-03-22: qty 50

## 2013-03-22 MED ORDER — ACETAMINOPHEN 325 MG PO TABS
650.0000 mg | ORAL_TABLET | Freq: Once | ORAL | Status: AC
  Administered 2013-03-22: 650 mg via ORAL

## 2013-03-22 MED ORDER — HEPARIN SOD (PORK) LOCK FLUSH 100 UNIT/ML IV SOLN
500.0000 [IU] | Freq: Once | INTRAVENOUS | Status: AC | PRN
  Administered 2013-03-22: 500 [IU]
  Filled 2013-03-22: qty 5

## 2013-03-22 NOTE — Patient Instructions (Addendum)
[facility] Discharge Instructions for Patients Receiving Chemotherapy  Today you received the following chemotherapy agents: Adriamycin, Vincristine, Cytoxan, Rituxan.  To help prevent nausea and vomiting after your treatment, we encourage you to take your nausea medication.   If you develop nausea and vomiting that is not controlled by your nausea medication, call the clinic.   BELOW ARE SYMPTOMS THAT SHOULD BE REPORTED IMMEDIATELY:  *FEVER GREATER THAN 100.5 F  *CHILLS WITH OR WITHOUT FEVER  NAUSEA AND VOMITING THAT IS NOT CONTROLLED WITH YOUR NAUSEA MEDICATION  *UNUSUAL SHORTNESS OF BREATH  *UNUSUAL BRUISING OR BLEEDING  TENDERNESS IN MOUTH AND THROAT WITH OR WITHOUT PRESENCE OF ULCERS  *URINARY PROBLEMS  *BOWEL PROBLEMS  UNUSUAL RASH Items with * indicate a potential emergency and should be followed up as soon as possible.  Feel free to call the clinic you have any questions or concerns. The clinic phone number is [phone].

## 2013-03-23 ENCOUNTER — Ambulatory Visit (HOSPITAL_COMMUNITY)
Admission: RE | Admit: 2013-03-23 | Discharge: 2013-03-23 | Disposition: A | Discharge status: Home / Self Care | Payer: Medicare Other | Source: Ambulatory Visit | Attending: Nurse Practitioner | Admitting: Nurse Practitioner

## 2013-03-23 ENCOUNTER — Ambulatory Visit (HOSPITAL_BASED_OUTPATIENT_CLINIC_OR_DEPARTMENT_OTHER): Payer: Medicare Other

## 2013-03-23 ENCOUNTER — Ambulatory Visit (HOSPITAL_COMMUNITY)
Admission: RE | Admit: 2013-03-23 | Discharge: 2013-03-23 | Disposition: A | Discharge status: Home / Self Care | Payer: Medicare Other | Source: Ambulatory Visit | Attending: Oncology | Admitting: Oncology

## 2013-03-23 ENCOUNTER — Other Ambulatory Visit: Payer: Self-pay | Admitting: Oncology

## 2013-03-23 ENCOUNTER — Other Ambulatory Visit (HOSPITAL_COMMUNITY): Payer: Self-pay | Admitting: Oncology

## 2013-03-23 VITALS — BP 154/75 | HR 75 | Temp 97.5°F

## 2013-03-23 VITALS — BP 136/77 | HR 78 | Temp 96.8°F | Resp 18

## 2013-03-23 DIAGNOSIS — Z5189 Encounter for other specified aftercare: Secondary | ICD-10-CM

## 2013-03-23 DIAGNOSIS — C8510 Unspecified B-cell lymphoma, unspecified site: Secondary | ICD-10-CM

## 2013-03-23 DIAGNOSIS — R112 Nausea with vomiting, unspecified: Secondary | ICD-10-CM

## 2013-03-23 DIAGNOSIS — C8589 Other specified types of non-Hodgkin lymphoma, extranodal and solid organ sites: Secondary | ICD-10-CM | POA: Insufficient documentation

## 2013-03-23 DIAGNOSIS — Z5111 Encounter for antineoplastic chemotherapy: Secondary | ICD-10-CM | POA: Insufficient documentation

## 2013-03-23 LAB — GRAM STAIN

## 2013-03-23 LAB — PROTEIN, CSF: Total  Protein, CSF: 30 mg/dL (ref 15–45)

## 2013-03-23 LAB — CSF CELL COUNT WITH DIFFERENTIAL
RBC Count, CSF: 2 /mm3 — ABNORMAL HIGH
Tube #: 1

## 2013-03-23 MED ORDER — FOSAPREPITANT DIMEGLUMINE INJECTION 150 MG
150.0000 mg | Freq: Once | INTRAVENOUS | Status: AC
  Administered 2013-03-23: 150 mg via INTRAVENOUS
  Filled 2013-03-23: qty 5

## 2013-03-23 MED ORDER — HEPARIN SOD (PORK) LOCK FLUSH 100 UNIT/ML IV SOLN
INTRAVENOUS | Status: AC
  Administered 2013-03-23: 500 [IU]
  Filled 2013-03-23: qty 5

## 2013-03-23 MED ORDER — SODIUM CHLORIDE 0.9 % IV SOLN
Freq: Every day | INTRAVENOUS | Status: AC
  Administered 2013-03-23: 09:00:00 via INTRAVENOUS

## 2013-03-23 MED ORDER — HEPARIN SOD (PORK) LOCK FLUSH 100 UNIT/ML IV SOLN: 500.0000 [IU] | INTRAVENOUS | Status: AC | PRN

## 2013-03-23 MED ORDER — SODIUM CHLORIDE 0.9 % IJ SOLN
10.0000 mL | INTRAMUSCULAR | Status: AC | PRN
  Administered 2013-03-23: 10 mL

## 2013-03-23 MED ORDER — LORAZEPAM 2 MG/ML IJ SOLN
0.5000 mg | Freq: Once | INTRAMUSCULAR | Status: AC
  Administered 2013-03-23: 0.5 mg via INTRAVENOUS
  Filled 2013-03-23: qty 0.25
  Filled 2013-03-23: qty 1

## 2013-03-23 MED ORDER — SODIUM CHLORIDE 0.9 % IV SOLN
8.0000 mg | Freq: Once | INTRAVENOUS | Status: AC
  Administered 2013-03-23: 8 mg via INTRAVENOUS
  Filled 2013-03-23: qty 4

## 2013-03-23 MED ORDER — SODIUM CHLORIDE 0.9 % IJ SOLN
Freq: Once | INTRAMUSCULAR | Status: AC
  Administered 2013-03-23: 10:00:00 via INTRATHECAL
  Filled 2013-03-23: qty 0.48

## 2013-03-23 MED ORDER — PEGFILGRASTIM INJECTION 6 MG/0.6ML
6.0000 mg | Freq: Once | SUBCUTANEOUS | Status: AC
  Administered 2013-03-23: 6 mg via SUBCUTANEOUS
  Filled 2013-03-23: qty 0.6

## 2013-03-23 NOTE — Progress Notes (Signed)
Notified Dr Cyndie Chime re: pts post. N/V . Received orders for Emend IV.

## 2013-03-23 NOTE — Procedures (Signed)
Technique:  Informed consent was obtained from the patient prior to the procedure, including potential complications of headache, allergy, and pain.   With the patient prone, the lower back was prepped with Betadine.  1% Lidocaine was used for local anesthesia.  Lumbar puncture was performed at the [L2-L3] level using a [20] gauge needle with return of [clear] CSF. 8 ml of CSF was collected for laboratory analysis.  [10 ml]  of  [methotrexate  hydrocortisone solution] was injected into the subarachnoid space by slow infusion.  The patient tolerated the procedure well without apparent complication. IMPRESSION: [ 1..  Successful lumbar puncture with CSF collection. 2.  Successful injection of methotrexate/hydrocortisone solution.]

## 2013-04-05 ENCOUNTER — Encounter (HOSPITAL_COMMUNITY): Payer: Self-pay

## 2013-04-05 ENCOUNTER — Telehealth: Payer: Self-pay | Admitting: Oncology

## 2013-04-05 ENCOUNTER — Ambulatory Visit (HOSPITAL_COMMUNITY)
Admission: RE | Admit: 2013-04-05 | Discharge: 2013-04-05 | Disposition: A | Discharge status: Home / Self Care | Payer: Medicare Other | Source: Ambulatory Visit | Attending: Oncology | Admitting: Oncology

## 2013-04-05 ENCOUNTER — Ambulatory Visit (HOSPITAL_BASED_OUTPATIENT_CLINIC_OR_DEPARTMENT_OTHER): Payer: Medicare Other | Admitting: Nurse Practitioner

## 2013-04-05 ENCOUNTER — Other Ambulatory Visit (HOSPITAL_BASED_OUTPATIENT_CLINIC_OR_DEPARTMENT_OTHER): Payer: Medicare Other | Admitting: Lab

## 2013-04-05 VITALS — BP 134/77 | HR 88 | Temp 98.3°F | Resp 18 | Ht 67.0 in | Wt 178.3 lb

## 2013-04-05 DIAGNOSIS — C8589 Other specified types of non-Hodgkin lymphoma, extranodal and solid organ sites: Secondary | ICD-10-CM

## 2013-04-05 DIAGNOSIS — J329 Chronic sinusitis, unspecified: Secondary | ICD-10-CM | POA: Insufficient documentation

## 2013-04-05 DIAGNOSIS — R112 Nausea with vomiting, unspecified: Secondary | ICD-10-CM

## 2013-04-05 DIAGNOSIS — C8510 Unspecified B-cell lymphoma, unspecified site: Secondary | ICD-10-CM

## 2013-04-05 DIAGNOSIS — Z79899 Other long term (current) drug therapy: Secondary | ICD-10-CM | POA: Insufficient documentation

## 2013-04-05 LAB — CBC WITH DIFFERENTIAL/PLATELET
Eosinophils Absolute: 0.1 10*3/uL (ref 0.0–0.5)
HCT: 29.6 % — ABNORMAL LOW (ref 34.8–46.6)
LYMPH%: 15.3 % (ref 14.0–49.7)
MONO#: 0.6 10*3/uL (ref 0.1–0.9)
NEUT#: 5.5 10*3/uL (ref 1.5–6.5)
NEUT%: 74.6 % (ref 38.4–76.8)
Platelets: 218 10*3/uL (ref 145–400)
WBC: 7.4 10*3/uL (ref 3.9–10.3)

## 2013-04-05 LAB — COMPREHENSIVE METABOLIC PANEL (CC13)
BUN: 12 mg/dL (ref 7.0–26.0)
CO2: 28 mEq/L (ref 22–29)
Creatinine: 0.9 mg/dL (ref 0.6–1.1)
Glucose: 87 mg/dl (ref 70–140)
Total Bilirubin: 0.33 mg/dL (ref 0.20–1.20)

## 2013-04-05 LAB — LACTATE DEHYDROGENASE (CC13): LDH: 199 U/L (ref 125–245)

## 2013-04-05 MED ORDER — IOHEXOL 300 MG/ML  SOLN
100.0000 mL | Freq: Once | INTRAMUSCULAR | Status: AC | PRN
  Administered 2013-04-05: 100 mL via INTRAVENOUS

## 2013-04-05 NOTE — Telephone Encounter (Signed)
gave pt appt for lab, Md, injection, Pet scan and emailed Marissa Clayton for chemo appts for Augus, Md visit moved to 9/9 per ML

## 2013-04-05 NOTE — Progress Notes (Addendum)
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 Staging bone marrow biopsy on 02/05/2013 was slightly hypercellular with trilineage hematopoiesis and no evidence for involvement with lymphoma. A lumbar puncture was done on on 02/05/2013. The cerebrospinal fluid showed no malignant cells.   She completed cycle #1 CHOP/Rituxan beginning 02/08/2013 (she began the prednisone portion of the treatment on 02/03/2013 for symptomatic relief). She completed cycle 2 CHOP/Rituxan on 03/01/2013. She completed the first prophylactic intrathecal methotrexate treatment on 03/02/2013. She had significant nausea/vomiting which eventually responded to Emend. She completed cycle 3 CHOP/Rituxan on 03/22/2013 and cycle 2 prophylactic intrathecal methotrexate on 03/23/2013.  She is seen today for scheduled followup. She reports doing well with the most recent chemotherapy. She had some mild nausea. Gums became "sore". No oral ulcers. No diarrhea or constipation. No numbness or tingling in her hands or feet. Approximately 4 hours after the intrathecal treatment on 03/13/2013 she developed nausea and dry heaves. She had a headache the following day.  Objective: Blood pressure 134/77, pulse 88, temperature 98.3 F (36.8 C), temperature source Oral, resp. rate 18, height 5\' 7"  (1.702 m), weight 178 lb 4.8 oz (80.876 kg), SpO2 100.00%.  Oropharynx is without thrush or ulceration. No palpable cervical, supraclavicular or axillary lymph nodes. Lungs are clear. Regular cardiac rhythm. Port-A-Cath site is without erythema. Abdomen is soft and nontender. No organomegaly. Extremities are without edema. Motor strength 5 over 5. Knee DTRs 1+, symmetric. Sensation intact over the fingertips per tuning fork exam.  Lab Results: Lab  Results  Component Value Date   WBC 7.4 04/05/2013   HGB 10.4* 04/05/2013   HCT 29.6* 04/05/2013   MCV 86.3 04/05/2013   PLT 218 04/05/2013    Chemistry:    Chemistry      Component Value Date/Time   NA 141 04/05/2013 1444   NA 143 05/06/2011 0545   K 4.1 04/05/2013 1444   K 3.5 05/06/2011 0545   CL 103 02/03/2013 1002   CL 105 05/06/2011 0545   CO2 28 04/05/2013 1444   CO2 31 05/06/2011 0545   BUN 12.0 04/05/2013 1444   BUN 16 05/06/2011 0545   CREATININE 0.9 04/05/2013 1444   CREATININE 0.83 05/06/2011 0545      Component Value Date/Time   CALCIUM 9.4 04/05/2013 1444   CALCIUM 9.0 05/06/2011 0545   ALKPHOS 78 04/05/2013 1444   AST 15 04/05/2013 1444   ALT 14 04/05/2013 1444   BILITOT 0.33 04/05/2013 1444       Studies/Results: Ct Maxillofacial W/cm  04/05/2013   *RADIOLOGY REPORT*  Clinical Data: Non Hodgkin lymphoma of the sinus.  Nasal pharyngeal mass.  CT MAXILLOFACIAL WITH CONTRAST  Technique:  Multidetector CT imaging of the maxillofacial structures was performed with intravenous contrast. Multiplanar CT image reconstructions were also generated.  Contrast: OMNIPAQUE IOHEXOL 300 MG/ML  SOLN  Comparison: PET 01/26/2013.  MRI 02/05/2013  Findings: Previous identified posterior nasal pharyngeal mass on the right has resolved.  Nasopharynx and tonsillar area now is symmetric and without mass lesion.  The  tongue  is normal.  Epiglottis and larynx are normal. Submandibular and parotid glands are normal. Normal orbit.  Negative for adenopathy in the upper neck.  Progression of diffuse mucosal thickening in the paranasal sinuses. Air-fluid level right frontal and left sphenoid sinuses.  No acute bony  destruction.  IMPRESSION: Posterior nasal pharyngeal mass has resolved.  The patient  has developed sinusitis with air-fluid levels since the prior study.   Original Report Authenticated By: Janeece Riggers, M.D.   Dg Fluoro Guide Spinal/si Jt Inj Left  03/23/2013   *RADIOLOGY REPORT*  Clinical  Data:  High-grade lymphoma.  Methotrexate to intrathecal injection for central nervous system chemo prophylaxed X.  FLUOROSCOPICALLY GUIDED LUMBAR PUNCTURE FOR INTRATHECAL CHEMOTHERAPY  Fluoroscopy time:   dictate in minutes & seconds   Technique:  Informed consent was obtained from the patient prior to the procedure, including potential complications of headache, allergy, and pain.   With the patient prone, the lower back was prepped with Betadine.  1% Lidocaine was used for local anesthesia. Lumbar puncture was performed at the L2-L3 level using a 20 gauge needle with return of clear CSF. 8 ml of CSF was collected for laboratory analysis.  10 ml  of  methotrexate  steroid solution was injected into the subarachnoid space.  The patient tolerated the procedure well without apparent complication.  IMPRESSION:    1.  Successful lumbar puncture with CSF collection.  2.  Successful injection of methotrexate / steroid solution.   Original Report Authenticated By: Genevive Bi, M.D.    Medications: I have reviewed the patient's current medications.  Assessment/Plan:  1. High-grade B-cell non-Hodgkin's lymphoma involving the nasopharynx and level 3/4 cervical lymph nodes with evidence for local invasion into the clivus. Staging bone marrow biopsy negative for involvement with lymphoma. Cerebrospinal fluid negative as well. Status post cycle 1 CHOP/Rituxan 02/08/2013 (prednisone initiated 02/03/2013 for symptomatic relief). Status post cycle 2 CHOP/Rituxan 03/01/2013 and cycle 3 03/22/2013. Status post cycle 1 prophylactic intrathecal methotrexate 03/02/2013, cycle 2 03/23/2013. 2. Right ear pain. Resolved. 3. Delayed nausea following cycle 1 CHOP/Rituxan. Aloxi will be added beginning with cycle 2. 4. Constipation following cycle 1 CHOP/Rituxan. She will begin a gentle laxative one to 2 days before cycle 2. 5. Hyperemesis following intrathecal methotrexate. The anti-emetic premedication regimen will be  adjusted with cycle 3.  Disposition-Marissa Clayton appears stable. She has completed 3 cycles of CHOP/Rituxan and 2 cycles of prophylactic intrathecal methotrexate. Restaging CT evaluation done earlier today shows resolution of the posterior nasal pharyngeal mass.  Dr. Cyndie Chime recommends proceeding with a fourth cycle of CHOP/Rituxan on 04/12/2013; referral to Dr. Lance Bosch, radiation oncology; cycle 3 intrathecal methotrexate 04/13/2013.  She will return for a followup visit with Dr. Cyndie Chime on 05/04/2013.  We did not have the restaging CT results available while she was in the office. We will contact her with the result.  Lonna Cobb ANP/GNP-BC   Hematology attending: 67 year old woman under active treatment for a high-grade, B-cell, non-Hodgkin's lymphoma presenting with a nasopharyngeal mass with local erosion into the bones of the base of her skull. She is receiving a combination of systemic and intrathecal chemotherapy. She is at significant delayed nausea and vomiting with the intrathecal treatments. I have tried a number of antiemetics. These have been partially successful. The main problem with the second intrathecal treatment didn't occur until she tried to get up from a supine position approximately 4 hours after the procedure. This precipitated recurrent vomiting.  I Reviewed again in detail with her and her daughter how important the prophylactic intrathecal chemotherapy is. She is at a very high risk for central nervous system involvement. She understands. She agrees to proceed with 2 additional planned treatments. She has one additional chemotherapy cycle to complete 4 planned cycles. I will refer  her to radiation oncology at this time for an opinion on consolidative radiation to her initial set of disease. We'll get a PET scan in anticipation of this visit to see the extent of her response so far.  Cephas Darby, MD, FACP  Hematology-Oncology

## 2013-04-06 ENCOUNTER — Telehealth: Payer: Self-pay | Admitting: Oncology

## 2013-04-06 ENCOUNTER — Telehealth: Payer: Self-pay | Admitting: *Deleted

## 2013-04-06 NOTE — Telephone Encounter (Signed)
talked to pt and gave her appt for lab @ 0830 and chemo @ 9am on 8/18, explained to pt the changes of time. Also gave her the rest of the appt

## 2013-04-06 NOTE — Telephone Encounter (Signed)
Per staff message and POF I have scheduled appts. Advised scheduler that lab needs to be moved JMW

## 2013-04-07 ENCOUNTER — Other Ambulatory Visit: Payer: Self-pay | Admitting: Oncology

## 2013-04-07 DIAGNOSIS — C8510 Unspecified B-cell lymphoma, unspecified site: Secondary | ICD-10-CM

## 2013-04-08 ENCOUNTER — Telehealth: Payer: Self-pay | Admitting: *Deleted

## 2013-04-08 NOTE — Telephone Encounter (Signed)
Spoke with patient.  Let her know that lymph node mass has completely shrunk down.  She still needs to complete her treatments.  She appreciated the phone call.

## 2013-04-08 NOTE — Telephone Encounter (Signed)
Message copied by Orbie Hurst on Thu Apr 08, 2013 12:46 PM ------      Message from: Levert Feinstein      Created: Tue Apr 06, 2013  9:05 PM       Call patient - lymph node mass has completely shrunk down!      Still need to complete her treatments ------

## 2013-04-09 ENCOUNTER — Other Ambulatory Visit: Payer: Self-pay | Admitting: *Deleted

## 2013-04-09 ENCOUNTER — Encounter (HOSPITAL_COMMUNITY)
Admission: RE | Admit: 2013-04-09 | Discharge: 2013-04-09 | Disposition: A | Discharge status: Home / Self Care | Payer: Medicare Other | Source: Ambulatory Visit | Attending: Nurse Practitioner | Admitting: Nurse Practitioner

## 2013-04-09 ENCOUNTER — Encounter (HOSPITAL_COMMUNITY): Payer: Self-pay

## 2013-04-09 DIAGNOSIS — C8589 Other specified types of non-Hodgkin lymphoma, extranodal and solid organ sites: Secondary | ICD-10-CM | POA: Insufficient documentation

## 2013-04-09 DIAGNOSIS — C8510 Unspecified B-cell lymphoma, unspecified site: Secondary | ICD-10-CM

## 2013-04-09 MED ORDER — FLUDEOXYGLUCOSE F - 18 (FDG) INJECTION
18.9000 | Freq: Once | INTRAVENOUS | Status: AC | PRN
  Administered 2013-04-09: 18.9 via INTRAVENOUS

## 2013-04-12 ENCOUNTER — Other Ambulatory Visit (HOSPITAL_BASED_OUTPATIENT_CLINIC_OR_DEPARTMENT_OTHER): Payer: Medicare Other | Admitting: Lab

## 2013-04-12 ENCOUNTER — Ambulatory Visit (HOSPITAL_BASED_OUTPATIENT_CLINIC_OR_DEPARTMENT_OTHER): Payer: Medicare Other

## 2013-04-12 VITALS — BP 125/70 | HR 78 | Temp 98.1°F

## 2013-04-12 DIAGNOSIS — Z5112 Encounter for antineoplastic immunotherapy: Secondary | ICD-10-CM

## 2013-04-12 DIAGNOSIS — C8510 Unspecified B-cell lymphoma, unspecified site: Secondary | ICD-10-CM

## 2013-04-12 DIAGNOSIS — C8589 Other specified types of non-Hodgkin lymphoma, extranodal and solid organ sites: Secondary | ICD-10-CM

## 2013-04-12 DIAGNOSIS — Z5111 Encounter for antineoplastic chemotherapy: Secondary | ICD-10-CM

## 2013-04-12 LAB — CBC WITH DIFFERENTIAL/PLATELET
BASO%: 2.4 % — ABNORMAL HIGH (ref 0.0–2.0)
EOS%: 0.5 % (ref 0.0–7.0)
MCH: 30.7 pg (ref 25.1–34.0)
MCHC: 35.3 g/dL (ref 31.5–36.0)
MONO%: 9.5 % (ref 0.0–14.0)
NEUT%: 70.2 % (ref 38.4–76.8)
RDW: 17.7 % — ABNORMAL HIGH (ref 11.2–14.5)
lymph#: 0.9 10*3/uL (ref 0.9–3.3)

## 2013-04-12 MED ORDER — PALONOSETRON HCL INJECTION 0.25 MG/5ML
0.2500 mg | Freq: Once | INTRAVENOUS | Status: AC
  Administered 2013-04-12: 0.25 mg via INTRAVENOUS

## 2013-04-12 MED ORDER — CYCLOPHOSPHAMIDE CHEMO INJECTION 1 GM
750.0000 mg/m2 | Freq: Once | INTRAMUSCULAR | Status: AC
  Administered 2013-04-12: 1500 mg via INTRAVENOUS
  Filled 2013-04-12: qty 75

## 2013-04-12 MED ORDER — VINCRISTINE SULFATE CHEMO INJECTION 1 MG/ML
2.0000 mg | Freq: Once | INTRAVENOUS | Status: AC
  Administered 2013-04-12: 2 mg via INTRAVENOUS
  Filled 2013-04-12: qty 2

## 2013-04-12 MED ORDER — SODIUM CHLORIDE 0.9 % IV SOLN
Freq: Once | INTRAVENOUS | Status: AC
  Administered 2013-04-12: 09:00:00 via INTRAVENOUS

## 2013-04-12 MED ORDER — SODIUM CHLORIDE 0.9 % IV SOLN
375.0000 mg/m2 | Freq: Once | INTRAVENOUS | Status: AC
  Administered 2013-04-12: 700 mg via INTRAVENOUS
  Filled 2013-04-12: qty 70

## 2013-04-12 MED ORDER — DOXORUBICIN HCL CHEMO IV INJECTION 2 MG/ML
50.0000 mg/m2 | Freq: Once | INTRAVENOUS | Status: AC
  Administered 2013-04-12: 100 mg via INTRAVENOUS
  Filled 2013-04-12: qty 50

## 2013-04-12 MED ORDER — DIPHENHYDRAMINE HCL 25 MG PO CAPS
50.0000 mg | ORAL_CAPSULE | Freq: Once | ORAL | Status: AC
  Administered 2013-04-12: 50 mg via ORAL

## 2013-04-12 MED ORDER — HEPARIN SOD (PORK) LOCK FLUSH 100 UNIT/ML IV SOLN
500.0000 [IU] | Freq: Once | INTRAVENOUS | Status: AC | PRN
  Administered 2013-04-12: 500 [IU]
  Filled 2013-04-12: qty 5

## 2013-04-12 MED ORDER — SODIUM CHLORIDE 0.9 % IJ SOLN
10.0000 mL | INTRAMUSCULAR | Status: AC | PRN
  Administered 2013-04-12: 10 mL
  Filled 2013-04-12: qty 10

## 2013-04-12 MED ORDER — ACETAMINOPHEN 325 MG PO TABS
650.0000 mg | ORAL_TABLET | Freq: Once | ORAL | Status: AC
  Administered 2013-04-12: 650 mg via ORAL

## 2013-04-12 MED ORDER — DEXAMETHASONE SODIUM PHOSPHATE 20 MG/5ML IJ SOLN
20.0000 mg | Freq: Once | INTRAMUSCULAR | Status: AC
  Administered 2013-04-12: 20 mg via INTRAVENOUS

## 2013-04-12 NOTE — Progress Notes (Unsigned)
Pt states " I think I'm getting a cold, coughing up green stuff and runny nose" reviewed with MD, pt to continue to monitor symptoms call with fever, sore throat, pain. Encouraged fluids and chicken noodle soup. Ok to treat

## 2013-04-12 NOTE — Patient Instructions (Signed)
[facility] Discharge Instructions for Patients Receiving Chemotherapy  Today you received the following chemotherapy agents Rchop ( rituxan, cytoxan, vincristine, adriamycin)  To help prevent nausea and vomiting after your treatment, we encourage you to take your nausea medication as directed. If you develop nausea and vomiting that is not controlled by your nausea medication, call the clinic.   BELOW ARE SYMPTOMS THAT SHOULD BE REPORTED IMMEDIATELY:  *FEVER GREATER THAN 100.5 F  *CHILLS WITH OR WITHOUT FEVER  NAUSEA AND VOMITING THAT IS NOT CONTROLLED WITH YOUR NAUSEA MEDICATION  *UNUSUAL SHORTNESS OF BREATH  *UNUSUAL BRUISING OR BLEEDING  TENDERNESS IN MOUTH AND THROAT WITH OR WITHOUT PRESENCE OF ULCERS  *URINARY PROBLEMS  *BOWEL PROBLEMS  UNUSUAL RASH Items with * indicate a potential emergency and should be followed up as soon as possible.  Feel free to call the clinic you have any questions or concerns. The clinic phone number is [phone].

## 2013-04-13 ENCOUNTER — Ambulatory Visit (HOSPITAL_BASED_OUTPATIENT_CLINIC_OR_DEPARTMENT_OTHER): Payer: Medicare Other

## 2013-04-13 ENCOUNTER — Other Ambulatory Visit (HOSPITAL_COMMUNITY): Payer: Self-pay | Admitting: Oncology

## 2013-04-13 ENCOUNTER — Ambulatory Visit (HOSPITAL_COMMUNITY)
Admission: RE | Admit: 2013-04-13 | Discharge: 2013-04-13 | Disposition: A | Discharge status: Home / Self Care | Payer: Medicare Other | Source: Ambulatory Visit | Attending: Nurse Practitioner | Admitting: Nurse Practitioner

## 2013-04-13 ENCOUNTER — Other Ambulatory Visit: Payer: Medicare Other | Admitting: Lab

## 2013-04-13 ENCOUNTER — Other Ambulatory Visit: Payer: Self-pay | Admitting: *Deleted

## 2013-04-13 ENCOUNTER — Other Ambulatory Visit: Payer: Self-pay | Admitting: Radiology

## 2013-04-13 ENCOUNTER — Encounter (HOSPITAL_COMMUNITY): Payer: Self-pay

## 2013-04-13 VITALS — BP 120/63 | HR 81 | Temp 97.6°F | Resp 18

## 2013-04-13 DIAGNOSIS — Z5111 Encounter for antineoplastic chemotherapy: Secondary | ICD-10-CM | POA: Insufficient documentation

## 2013-04-13 DIAGNOSIS — R112 Nausea with vomiting, unspecified: Secondary | ICD-10-CM

## 2013-04-13 DIAGNOSIS — C8589 Other specified types of non-Hodgkin lymphoma, extranodal and solid organ sites: Secondary | ICD-10-CM | POA: Insufficient documentation

## 2013-04-13 DIAGNOSIS — C8510 Unspecified B-cell lymphoma, unspecified site: Secondary | ICD-10-CM

## 2013-04-13 LAB — GRAM STAIN

## 2013-04-13 LAB — PROTEIN, CSF: Total  Protein, CSF: 29 mg/dL (ref 15–45)

## 2013-04-13 LAB — CSF CELL COUNT WITH DIFFERENTIAL
RBC Count, CSF: 1 /mm3 — ABNORMAL HIGH
WBC, CSF: 1 /mm3 (ref 0–5)

## 2013-04-13 MED ORDER — SODIUM CHLORIDE 0.9 % IJ SOLN
Freq: Once | INTRAMUSCULAR | Status: AC
  Administered 2013-04-13: 11:00:00 via INTRATHECAL
  Filled 2013-04-13: qty 0.48

## 2013-04-13 MED ORDER — MIDAZOLAM HCL 2 MG/2ML IJ SOLN: 2.0000 mg | Freq: Once | INTRAMUSCULAR | Status: DC

## 2013-04-13 MED ORDER — PEGFILGRASTIM INJECTION 6 MG/0.6ML
6.0000 mg | Freq: Once | SUBCUTANEOUS | Status: AC
Start: 2013-04-13 — End: 2013-04-13
  Administered 2013-04-13: 6 mg via SUBCUTANEOUS
  Filled 2013-04-13: qty 0.6

## 2013-04-13 MED ORDER — PALONOSETRON HCL INJECTION 0.25 MG/5ML
0.2500 mg | Freq: Once | INTRAVENOUS | Status: AC
  Administered 2013-04-13: 0.25 mg via INTRAVENOUS
  Filled 2013-04-13: qty 5

## 2013-04-13 MED ORDER — SODIUM CHLORIDE 0.9 % IV SOLN
150.0000 mg | Freq: Once | INTRAVENOUS | Status: AC
  Administered 2013-04-13: 150 mg via INTRAVENOUS
  Filled 2013-04-13: qty 5

## 2013-04-13 MED ORDER — SODIUM CHLORIDE 0.9 % IV SOLN
Freq: Once | INTRAVENOUS | Status: AC
  Administered 2013-04-13: 09:00:00 via INTRAVENOUS

## 2013-04-13 MED ORDER — HEPARIN SOD (PORK) LOCK FLUSH 100 UNIT/ML IV SOLN
INTRAVENOUS | Status: AC
  Administered 2013-04-13: 500 [IU]
  Filled 2013-04-13: qty 5

## 2013-04-13 MED ORDER — SODIUM CHLORIDE 0.9 % IJ SOLN
10.0000 mL | Freq: Once | INTRAMUSCULAR | Status: AC
  Administered 2013-04-13: 10 mL

## 2013-04-13 MED ORDER — LORAZEPAM 2 MG/ML IJ SOLN
0.5000 mg | Freq: Once | INTRAMUSCULAR | Status: AC
  Administered 2013-04-13: 0.5 mg via INTRAVENOUS
  Filled 2013-04-13: qty 0.25
  Filled 2013-04-13: qty 1

## 2013-04-13 NOTE — Progress Notes (Signed)
Upon discharge patient has mild nausea and headache. Patient is stable at discharge. Patient's daughter is with the patient at discharge. No medications needed after the procedure.

## 2013-04-13 NOTE — Progress Notes (Signed)
Pt here for Neulasta day after chemo.  Pt informed nurse that she was instructed by md to receive Neulasta injection today prior to procedure in radiology.  Gearldine Bienenstock, pharmacist informed of same above info.

## 2013-04-16 LAB — CSF CULTURE W GRAM STAIN: Culture: NO GROWTH

## 2013-04-28 ENCOUNTER — Other Ambulatory Visit: Payer: Medicare Other

## 2013-05-03 ENCOUNTER — Other Ambulatory Visit: Payer: Self-pay | Admitting: Oncology

## 2013-05-03 ENCOUNTER — Encounter (HOSPITAL_COMMUNITY): Payer: Self-pay | Admitting: Pharmacy Technician

## 2013-05-03 DIAGNOSIS — C8510 Unspecified B-cell lymphoma, unspecified site: Secondary | ICD-10-CM

## 2013-05-04 ENCOUNTER — Other Ambulatory Visit: Payer: Self-pay | Admitting: Nurse Practitioner

## 2013-05-04 ENCOUNTER — Encounter (HOSPITAL_COMMUNITY): Payer: Self-pay

## 2013-05-04 ENCOUNTER — Telehealth: Payer: Self-pay | Admitting: Oncology

## 2013-05-04 ENCOUNTER — Inpatient Hospital Stay (HOSPITAL_COMMUNITY): Admission: RE | Admit: 2013-05-04 | Payer: Medicare Other | Source: Ambulatory Visit

## 2013-05-04 ENCOUNTER — Other Ambulatory Visit (HOSPITAL_BASED_OUTPATIENT_CLINIC_OR_DEPARTMENT_OTHER): Payer: Medicare Other | Admitting: Lab

## 2013-05-04 ENCOUNTER — Ambulatory Visit (HOSPITAL_COMMUNITY)
Admission: RE | Admit: 2013-05-04 | Discharge: 2013-05-04 | Disposition: A | Discharge status: Home / Self Care | Payer: Medicare Other | Source: Ambulatory Visit | Attending: Nurse Practitioner | Admitting: Nurse Practitioner

## 2013-05-04 ENCOUNTER — Ambulatory Visit (HOSPITAL_COMMUNITY)
Admission: RE | Admit: 2013-05-04 | Discharge: 2013-05-04 | Disposition: A | Discharge status: Home / Self Care | Payer: Medicare Other | Source: Ambulatory Visit | Attending: Oncology | Admitting: Oncology

## 2013-05-04 ENCOUNTER — Ambulatory Visit (HOSPITAL_BASED_OUTPATIENT_CLINIC_OR_DEPARTMENT_OTHER): Payer: Medicare Other | Admitting: Oncology

## 2013-05-04 VITALS — BP 113/74 | HR 92 | Temp 96.8°F | Resp 20 | Ht 68.0 in | Wt 173.2 lb

## 2013-05-04 VITALS — BP 123/78 | HR 94 | Temp 97.1°F | Resp 18 | Ht 68.0 in | Wt 173.0 lb

## 2013-05-04 VITALS — BP 107/67 | HR 87 | Temp 97.0°F | Resp 20

## 2013-05-04 DIAGNOSIS — C8510 Unspecified B-cell lymphoma, unspecified site: Secondary | ICD-10-CM

## 2013-05-04 DIAGNOSIS — C8589 Other specified types of non-Hodgkin lymphoma, extranodal and solid organ sites: Secondary | ICD-10-CM

## 2013-05-04 LAB — CBC WITH DIFFERENTIAL/PLATELET
BASO%: 2.1 % — ABNORMAL HIGH (ref 0.0–2.0)
Eosinophils Absolute: 0 10*3/uL (ref 0.0–0.5)
LYMPH%: 15.4 % (ref 14.0–49.7)
MCH: 31.6 pg (ref 25.1–34.0)
MCHC: 35.3 g/dL (ref 31.5–36.0)
MCV: 89.5 fL (ref 79.5–101.0)
MONO%: 8.9 % (ref 0.0–14.0)
Platelets: 245 10*3/uL (ref 145–400)
RBC: 3.48 10*6/uL — ABNORMAL LOW (ref 3.70–5.45)

## 2013-05-04 LAB — GRAM STAIN

## 2013-05-04 LAB — COMPREHENSIVE METABOLIC PANEL (CC13)
Alkaline Phosphatase: 74 U/L (ref 40–150)
Glucose: 128 mg/dl (ref 70–140)
Sodium: 141 mEq/L (ref 136–145)
Total Bilirubin: 0.54 mg/dL (ref 0.20–1.20)
Total Protein: 6.9 g/dL (ref 6.4–8.3)

## 2013-05-04 LAB — CSF CELL COUNT WITH DIFFERENTIAL
RBC Count, CSF: 2 /mm3 — ABNORMAL HIGH
WBC, CSF: 0 /mm3 (ref 0–5)

## 2013-05-04 LAB — LACTATE DEHYDROGENASE (CC13): LDH: 205 U/L (ref 125–245)

## 2013-05-04 MED ORDER — LORAZEPAM 2 MG/ML IJ SOLN
1.0000 mg | Freq: Once | INTRAMUSCULAR | Status: AC
  Administered 2013-05-04: 1 mg via INTRAVENOUS
  Filled 2013-05-04: qty 1

## 2013-05-04 MED ORDER — ACETAMINOPHEN 500 MG PO TABS: 1000.0000 mg | ORAL_TABLET | Freq: Four times a day (QID) | ORAL | Status: DC | PRN

## 2013-05-04 MED ORDER — SODIUM CHLORIDE 0.9 % IJ SOLN
Freq: Once | INTRAMUSCULAR | Status: DC
  Filled 2013-05-04: qty 0.48

## 2013-05-04 MED ORDER — PALONOSETRON HCL INJECTION 0.25 MG/5ML
0.2500 mg | Freq: Once | INTRAVENOUS | Status: AC
  Administered 2013-05-04: 0.25 mg via INTRAVENOUS
  Filled 2013-05-04: qty 5

## 2013-05-04 MED ORDER — FOSAPREPITANT DIMEGLUMINE INJECTION 150 MG
150.0000 mg | Freq: Once | INTRAVENOUS | Status: AC
  Administered 2013-05-04: 150 mg via INTRAVENOUS
  Filled 2013-05-04: qty 5

## 2013-05-04 MED ORDER — HEPARIN SOD (PORK) LOCK FLUSH 100 UNIT/ML IV SOLN
500.0000 [IU] | INTRAVENOUS | Status: AC | PRN
  Administered 2013-05-04: 500 [IU]
  Filled 2013-05-04: qty 5

## 2013-05-04 MED ORDER — SODIUM CHLORIDE 0.9 % IV SOLN
INTRAVENOUS | Status: DC
  Administered 2013-05-04: 12:00:00 via INTRAVENOUS

## 2013-05-04 NOTE — Progress Notes (Signed)
Patient had no complaints of nausea or pain. Patient voided prior to discharge. Patient is stable at discharge.

## 2013-05-04 NOTE — Procedures (Signed)
Fluoro guided lumbar puncture performed at L3-L4.  Opening pressure 10 cm H20.  8 ml clear CSF obtained.  12 mg methotrexate injected intrathecally.  No complications.  See dictation for full report.

## 2013-05-04 NOTE — Telephone Encounter (Signed)
gv and printed appt sched for pt for NOV.Marissa KitchenMarland Clayton

## 2013-05-04 NOTE — Progress Notes (Signed)
Hematology and Oncology Follow Up Visit  Marissa Clayton [id number] 04-13-1946 67 y.o. 05/04/2013 12:18 PM   Principle Diagnosis: Encounter Diagnosis  Name Primary?  . High grade B-cell lymphoma Yes     Interim History:   Followup visit for this 67 year old woman with high-grade, B-cell, non-Hodgkin's lymphoma, presenting with progressive nasal congestion with findings of a large nasopharyngeal soft tissue mass. Please see previous notes for full details. The disease was limited to the head and neck on staging evaluation. She began treatment with CHOP-Rituxan and received 4 cycles between June 16 and 04/12/2013. Recent PET scan showed what I consider a complete response with dramatic resolution of hypermetabolic activity in the nasopharynx. Residual background uptake SUV 4. Due to the predilection for central nervous system involvement related to the location of the primary tumor, I elected to give her prophylactic intrathecal chemotherapy with methotrexate 12 mg plus hydrocortisone 100 mg. She will receive her fourth of 4 planned doses today to complete all chemotherapy.  Since her last visit, she has been evaluated by radiation oncology. Treatment planning was started. She will receive involved field radiation beginning September 22 with estimated treatment plan of 15 fractions.  She still has an intermittent cough which is primarily related to postnasal drip occurring primarily at night and early morning and then resolving during the day.  Medications: reviewed  Allergies: No Known Allergies  Review of Systems: Constitutional:   Generalized fatigue and weakness from the treatment. No fever or interim infection. She did develop diarrhea when given amoxicillin for her cough. HEENT no sore throat. Respiratory: No dyspnea. Cough as noted above. Cardiovascular:  No chest pain or palpitations Gastrointestinal: See above Genito-Urinary: No urinary tract symptoms Musculoskeletal: No muscle  bone or joint pain Neurologic: No headache or change in vision Skin: No rash or ecchymosis  Remaining ROS negative.    Physical Exam: Blood pressure 113/74, pulse 92, temperature 96.8 F (36 C), temperature source Oral, resp. rate 20, height 5\' 8"  (1.727 m), weight 173 lb 3.2 oz (78.563 kg). Wt Readings from Last 3 Encounters:  05/04/13 173 lb (78.472 kg)  05/04/13 173 lb 3.2 oz (78.563 kg)  04/13/13 179 lb (81.194 kg)     General appearance: Well-nourished Caucasian woman HENNT: Total alopecia from chemotherapy. Pharynx without erythema, exudate, or mass Lymph nodes: No cervical, supraclavicular, or axillary adenopathy Breasts: Lungs: Clear to auscultation resonant to percussion Heart: Regular rhythm no murmur Abdomen: Soft, nontender, no mass, no organomegaly Extremities: No edema, no calf tenderness Musculoskeletal: No joint deformities GU: Vascular: No carotid bruits, no cyanosis Neurologic: Mental status intact, cranial nerves grossly normal, motor strength 5 over 5, reflexes absent symmetric at the knees, 1+ symmetric at the biceps. Sensation mildly decreased to vibration over the fingertips Skin: No rash or ecchymosis  Lab Results: Lab Results  Component Value Date   WBC 4.4 05/04/2013   HGB 11.0* 05/04/2013   HCT 31.2* 05/04/2013   MCV 89.5 05/04/2013   PLT 245 05/04/2013     Chemistry      Component Value Date/Time   NA 141 05/04/2013 0928   NA 143 05/06/2011 0545   K 3.7 05/04/2013 0928   K 3.5 05/06/2011 0545   CL 103 02/03/2013 1002   CL 105 05/06/2011 0545   CO2 29 05/04/2013 0928   CO2 31 05/06/2011 0545   BUN 12.5 05/04/2013 0928   BUN 16 05/06/2011 0545   CREATININE 0.8 05/04/2013 0928   CREATININE 0.83 05/06/2011 0545  Component Value Date/Time   CALCIUM 9.9 05/04/2013 0928   CALCIUM 9.0 05/06/2011 0545   ALKPHOS 74 05/04/2013 0928   AST 25 05/04/2013 0928   ALT 26 05/04/2013 0928   BILITOT 0.54 05/04/2013 1610       Radiological Studies: Nm Pet Image Restag (ps)  Skull Base To Thigh  04/09/2013   *RADIOLOGY REPORT*  Clinical Data: Subsequent treatment strategy for restaging of nasopharyngeal non-Hodgkins lymphoma.  NUCLEAR MEDICINE PET SKULL BASE TO THIGH  Fasting Blood Glucose:  131  Technique:  18.9 mCi F-18 FDG was injected intravenously. CT data was obtained and used for attenuation correction and anatomic localization only.  (This was not acquired as a diagnostic CT examination.) Additional exam technical data entered on technologist worksheet.  Comparison:  CT maxillofacial of 04/05/2013.  MR brain 02/05/2013. Prior PET of 01/26/2013.  Findings:  Neck: Response to therapy of right nasaopharyngeal primary. Minimal low level hypermetabolism remains in this area, without residual soft tissue mass.  This measures a S.U.V. max of 4.1 on image 12/series 2.  Bilateral palatine tonsil hypermetabolism is favored be physiologic and is symmetric.  Previously described level II nodes are no longer identified and there are no hypermetabolic nodes seen.  Chest:  Left upper lobe reticular nodular opacity corresponds to hypermetabolism.  This measures a S.U.V. max of 6.8 on image 70/series 2. A posterior left upper lobe 5 mm nodule is unchanged and below the resolution of PET. Soft tissue thickening and clustered micronodules identified along the anterior aspect of the central left lower lobe.  This corresponds to mild hypermetabolism at PET.  Example image 87/series 3.  Abdomen/Pelvis:  No abnormal hypermetabolism.  Skeleton:  No abnormal marrow activity.  CT  images performed for attenuation correction demonstrate partial opacification of the sphenoid sinus with mucosal thickening of maxillary sinuses.  Coronary artery atherosclerosis.  A right-sided Port-A-Cath terminates at the cavoatrial junction.  Minimal nodularity within the right upper lobe, including on image 83/series 2.  Likely new since the prior PET and infectious/inflammatory.  Mild pelvic floor laxity.  IMPRESSION:   1.  Since 01/26/2013, response to therapy of nasopharyngeal primary/lymphoma.  Mild residual hypermetabolism without CT correlate.  Favored to be treatment related. 2.  No evidence of extra cervical lymphoma. 3.  New left upper and central left lower lobe reticular nodular opacities with hypermetabolism.  Suspicious for aspiration or atypical infection. 4.  Coronary artery atherosclerosis. 5. Sinus disease.   Original Report Authenticated By: Jeronimo Greaves, M.D.   Ct Maxillofacial W/cm  04/05/2013   *RADIOLOGY REPORT*  Clinical Data: Non Hodgkin lymphoma of the sinus.  Nasal pharyngeal mass.  CT MAXILLOFACIAL WITH CONTRAST  Technique:  Multidetector CT imaging of the maxillofacial structures was performed with intravenous contrast. Multiplanar CT image reconstructions were also generated.  Contrast: OMNIPAQUE IOHEXOL 300 MG/ML  SOLN  Comparison: PET 01/26/2013.  MRI 02/05/2013  Findings: Previous identified posterior nasal pharyngeal mass on the right has resolved.  Nasopharynx and tonsillar area now is symmetric and without mass lesion.  The  tongue  is normal.  Epiglottis and larynx are normal. Submandibular and parotid glands are normal. Normal orbit.  Negative for adenopathy in the upper neck.  Progression of diffuse mucosal thickening in the paranasal sinuses. Air-fluid level right frontal and left sphenoid sinuses.  No acute bony destruction.  IMPRESSION: Posterior nasal pharyngeal mass has resolved.  The patient  has developed sinusitis with air-fluid levels since the prior study.   Original Report Authenticated By:  Janeece Riggers, M.D.   Dg Fluoro Guide Lumbar Puncture  04/13/2013   CLINICAL DATA:  Non-Hodgkin's lymphoma.  EXAM: FLUOROSCOPICALLY GUIDED LUMBAR PUNCTURE FOR INTRATHECAL  CHEMOTHERAPY  TECHNIQUE: Informed consent was obtained from the patient prior to the procedure, including potential complications of headache, allergy, and pain. A 'time out' was performed. With the patient prone, the  lower back was prepped with Betadine. 1% Lidocaine was used for local anesthesia. Lumbar puncture was performed at the L3-4 using a 20 gauge  needle with return of clear CSF. 8-10 cc of clear CSF for was collected. 10 cc of methotrexate was injected into the subarachnoid space. The patient tolerated the procedure well without apparent complication.  FLUOROSCOPY TIME:  24 seconds.  IMPRESSION: Intrathecal injection of chemotherapy without complication   Electronically Signed   By: Charlett Nose   On: 04/13/2013 16:05    Impression: Extranodal high-grade B-cell non-Hodgkin's lymphoma arising in the nasopharynx  Complete response to initial chemotherapy.  Plan: She'll be given a brief treatment break and then receive involved field radiation starting on September 22.  I'll wait for one month after completion of radiation and then repeat a PET scan to confirm complete response.   CC:. Lance Bosch; Dr. Junita Push; Dr. Rosita Fire, MD 9/9/201412:18 PM

## 2013-05-08 LAB — CSF CULTURE W GRAM STAIN
Culture: NO GROWTH
Gram Stain: NONE SEEN

## 2013-05-10 ENCOUNTER — Other Ambulatory Visit (HOSPITAL_COMMUNITY): Payer: Medicare Other

## 2013-05-17 ENCOUNTER — Other Ambulatory Visit: Payer: Medicare Other

## 2013-05-18 ENCOUNTER — Telehealth: Payer: Self-pay | Admitting: *Deleted

## 2013-05-18 NOTE — Telephone Encounter (Signed)
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

## 2013-06-07 ENCOUNTER — Other Ambulatory Visit: Payer: Medicare Other

## 2013-06-16 ENCOUNTER — Telehealth: Payer: Self-pay | Admitting: Oncology

## 2013-06-16 NOTE — Telephone Encounter (Signed)
Gave pt apt for lab PET scan and ML on November ,ML r/s from 11/12 to 11/14

## 2013-07-05 ENCOUNTER — Other Ambulatory Visit (HOSPITAL_BASED_OUTPATIENT_CLINIC_OR_DEPARTMENT_OTHER): Payer: Medicare Other | Admitting: Lab

## 2013-07-05 ENCOUNTER — Encounter (HOSPITAL_COMMUNITY): Payer: Self-pay

## 2013-07-05 ENCOUNTER — Encounter (HOSPITAL_COMMUNITY)
Admission: RE | Admit: 2013-07-05 | Discharge: 2013-07-05 | Disposition: A | Discharge status: Home / Self Care | Payer: Medicare Other | Source: Ambulatory Visit | Attending: Oncology | Admitting: Oncology

## 2013-07-05 ENCOUNTER — Other Ambulatory Visit: Payer: Medicare Other

## 2013-07-05 DIAGNOSIS — C8589 Other specified types of non-Hodgkin lymphoma, extranodal and solid organ sites: Secondary | ICD-10-CM | POA: Insufficient documentation

## 2013-07-05 DIAGNOSIS — C8510 Unspecified B-cell lymphoma, unspecified site: Secondary | ICD-10-CM

## 2013-07-05 DIAGNOSIS — I2510 Atherosclerotic heart disease of native coronary artery without angina pectoris: Secondary | ICD-10-CM | POA: Insufficient documentation

## 2013-07-05 LAB — CBC WITH DIFFERENTIAL/PLATELET
Basophils Absolute: 0 10*3/uL (ref 0.0–0.1)
EOS%: 3.4 % (ref 0.0–7.0)
Eosinophils Absolute: 0.1 10*3/uL (ref 0.0–0.5)
HCT: 36.9 % (ref 34.8–46.6)
HGB: 12.7 g/dL (ref 11.6–15.9)
MCH: 30.8 pg (ref 25.1–34.0)
MONO#: 0.3 10*3/uL (ref 0.1–0.9)
NEUT#: 2.4 10*3/uL (ref 1.5–6.5)
RDW: 13.1 % (ref 11.2–14.5)
WBC: 3.7 10*3/uL — ABNORMAL LOW (ref 3.9–10.3)
lymph#: 0.8 10*3/uL — ABNORMAL LOW (ref 0.9–3.3)

## 2013-07-05 LAB — COMPREHENSIVE METABOLIC PANEL (CC13)
AST: 22 U/L (ref 5–34)
Albumin: 4.1 g/dL (ref 3.5–5.0)
Anion Gap: 11 mEq/L (ref 3–11)
BUN: 10.7 mg/dL (ref 7.0–26.0)
CO2: 25 mEq/L (ref 22–29)
Calcium: 10 mg/dL (ref 8.4–10.4)
Chloride: 106 mEq/L (ref 98–109)
Potassium: 3.7 mEq/L (ref 3.5–5.1)

## 2013-07-05 LAB — GLUCOSE, CAPILLARY: Glucose-Capillary: 108 mg/dL — ABNORMAL HIGH (ref 70–99)

## 2013-07-05 MED ORDER — FLUDEOXYGLUCOSE F - 18 (FDG) INJECTION
18.3000 | Freq: Once | INTRAVENOUS | Status: AC | PRN
  Administered 2013-07-05: 18.3 via INTRAVENOUS

## 2013-07-07 ENCOUNTER — Other Ambulatory Visit: Payer: Medicare Other

## 2013-07-07 ENCOUNTER — Ambulatory Visit: Payer: Medicare Other | Admitting: Nurse Practitioner

## 2013-07-07 ENCOUNTER — Telehealth: Payer: Self-pay | Admitting: *Deleted

## 2013-07-07 NOTE — Telephone Encounter (Signed)
Message copied by Gala Romney on Wed Jul 07, 2013 10:13 AM ------      Message from: Levert Feinstein      Created: Tue Jul 06, 2013  6:40 PM       Call pt:  PET scan shows remission of lymphoma ------

## 2013-07-07 NOTE — Telephone Encounter (Signed)
Per MD, notified pt that PET scan shows remission of lymphoma.  Pt very happy, verbalized understanding and expressed appreciation for call.

## 2013-07-09 ENCOUNTER — Telehealth: Payer: Self-pay | Admitting: Oncology

## 2013-07-09 ENCOUNTER — Ambulatory Visit (HOSPITAL_BASED_OUTPATIENT_CLINIC_OR_DEPARTMENT_OTHER): Payer: Medicare Other | Admitting: Nurse Practitioner

## 2013-07-09 VITALS — BP 135/83 | HR 73 | Temp 97.0°F | Resp 20 | Ht 68.0 in | Wt 162.1 lb

## 2013-07-09 DIAGNOSIS — C8589 Other specified types of non-Hodgkin lymphoma, extranodal and solid organ sites: Secondary | ICD-10-CM

## 2013-07-09 DIAGNOSIS — C8510 Unspecified B-cell lymphoma, unspecified site: Secondary | ICD-10-CM

## 2013-07-09 NOTE — Progress Notes (Addendum)
OFFICE PROGRESS NOTE  Interval history:   Ms. 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 Staging bone marrow biopsy on 02/05/2013 was slightly hypercellular with trilineage hematopoiesis and no evidence for involvement with lymphoma. A lumbar puncture was done on on 02/05/2013. The cerebrospinal fluid showed no malignant cells.  She completed cycle #1 CHOP/Rituxan beginning 02/08/2013 (she began the prednisone portion of the treatment on 02/03/2013 for symptomatic relief). She completed cycle 2 CHOP/Rituxan on 03/01/2013. She completed the first prophylactic intrathecal methotrexate treatment on 03/02/2013. She had significant nausea/vomiting which eventually responded to Emend. She completed cycle 3 CHOP/Rituxan on 03/22/2013 and cycle 2 prophylactic intrathecal methotrexate on 03/23/2013. Restaging CT evaluation 04/05/2013 showed resolution of the posterior nasal pharyngeal mass. PET scan 04/09/2013 showed response to therapy of nasopharyngeal primary/lymphoma. There was mild residual hypermetabolism without CT correlate favored to be treatment-related. She completed cycle 4 CHOP/Rituxan on 04/12/2013 and cycle 3 prophylactic intrathecal methotrexate on 04/13/2013. She completed the fourth and final prophylactic intrathecal methotrexate on 05/04/2013. She was referred to radiation oncology and completed involved field radiation beginning 05/17/2013. Restaging PET scan 07/06/2013 in anticipation of today's visit showed some mild asymmetric metabolic activity within the right oropharynx, likely physiologic as there is no CT correlate.  She continues to recover from radiation. Her throat continues to be "a little sore". She also notes soreness within the mouth. No ulcerations. Appetite decrease during the course of radiation. She denies  nausea/vomiting. Bowels moving. No fevers or sweats. She is having intermittent reflux symptoms and is taking Zantac as needed. Right ear pain has resolved. She occasionally notes pain at the right neck. Energy level is poor though she was able to "blow leaves" recently.   Objective: Blood pressure 135/83, pulse 73, temperature 97 F (36.1 C), temperature source Oral, resp. rate 20, height 5\' 8"  (1.727 m), weight 162 lb 1.6 oz (73.528 kg).  Patchy erythema at the posterior palate. No ulcerations. No palpable cervical, supraclavicular or axillary lymph nodes. Lungs are clear. No wheezes or rales. Regular cardiac rhythm. Port-A-Cath site is without erythema. Abdomen soft and nontender. No organomegaly. Extremities without edema. Calves soft and nontender. Motor strength 5 over 5. Knee DTRs absent symmetrically. Sensation mildly decreased over the fingertips per tuning fork exam.  Lab Results: Lab Results  Component Value Date   WBC 3.7* 07/05/2013   HGB 12.7 07/05/2013   HCT 36.9 07/05/2013   MCV 89.1 07/05/2013   PLT 181 07/05/2013    Chemistry:    Chemistry      Component Value Date/Time   NA 142 07/05/2013 0931   NA 143 05/06/2011 0545   K 3.7 07/05/2013 0931   K 3.5 05/06/2011 0545   CL 103 02/03/2013 1002   CL 105 05/06/2011 0545   CO2 25 07/05/2013 0931   CO2 31 05/06/2011 0545   BUN 10.7 07/05/2013 0931   BUN 16 05/06/2011 0545   CREATININE 0.8 07/05/2013 0931   CREATININE 0.83 05/06/2011 0545      Component Value Date/Time   CALCIUM 10.0 07/05/2013 0931   CALCIUM 9.0 05/06/2011 0545   ALKPHOS 66 07/05/2013 0931   AST 22 07/05/2013 0931   ALT 17 07/05/2013 0931   BILITOT 0.68 07/05/2013 0931       Studies/Results: Nm Pet Image Restag (ps) Skull Base To Thigh  07/05/2013   CLINICAL DATA:  Subsequent treatment strategy for  non Hodgkin lymphoma.  EXAM: NUCLEAR MEDICINE PET SKULL BASE TO THIGH  FASTING BLOOD GLUCOSE:  Value: 108mg /dl  TECHNIQUE: 16.1 mCi W-96 FDG was  injected intravenously. CT data was obtained and used for attenuation correction and anatomic localization only. (This was not acquired as a diagnostic CT examination.) Additional exam technical data entered on technologist worksheet.  COMPARISON:  04/09/2013.  FINDINGS: NECK  No hypermetabolic lymph nodes in the neck. There is some mild asymmetric metabolic activity with in the right oropharynx, likely physiologic as there is no CT correlate.  CT images show no acute findings. Visualized portions of the paranasal sinuses and mastoid air cells are clear.  CHEST  No hypermetabolic mediastinal or hilar lymph nodes. No hypermetabolic pulmonary nodules. Three-vessel coronary artery calcification. Heart size normal. No pericardial effusion. Lungs are grossly clear. No pleural fluid. Airway is unremarkable.  ABDOMEN/PELVIS  No abnormal metabolic activity in the liver, adrenal glands, spleen or pancreas. No hypermetabolic lymph nodes.  CT images show no acute findings. Liver, gallbladder, adrenal glands, kidneys, spleen, pancreas, stomach and bowel are grossly unremarkable. Uterus and ovaries are visualized. No free fluid. Bladder is decompressed. Atherosclerotic calcification of the arterial vasculature without abdominal aortic aneurysm.  SKELETON  No focal hypermetabolic activity to suggest skeletal metastasis.  IMPRESSION: 1. Mild asymmetric metabolic activity in the right oropharynx, likely physiologic given no CT correlate. No definite evidence of residual or recurrent disease. 2. Three-vessel coronary artery calcification.   Electronically Signed   By: Leanna Battles M.D.   On: 07/05/2013 14:08    Medications: I have reviewed the patient's current medications.  Assessment/Plan:  1. High-grade B-cell non-Hodgkin's lymphoma involving the nasopharynx and level 3/4 cervical lymph nodes with evidence for local invasion into the clivus. Staging bone marrow biopsy negative for involvement with lymphoma.  Cerebrospinal fluid negative as well. Status post cycle 1 CHOP/Rituxan 02/08/2013 (prednisone initiated 02/03/2013 for symptomatic relief). Status post cycle 2 CHOP/Rituxan 03/01/2013, cycle 3 03/22/2013 and cycle 4 04/12/2013. Status post cycle 1 prophylactic intrathecal methotrexate 03/02/2013, cycle 2 03/23/2013, cycle 3 04/13/2013 and cycle 4 05/04/2013. She completed involved field radiation beginning 05/17/2013. 2. Right ear pain. Resolved. 3. Delayed nausea following cycle 1 CHOP/Rituxan. Aloxi was added beginning with cycle 2. 4. Constipation following cycle 1 CHOP/Rituxan.  5. Hyperemesis following intrathecal methotrexate. The anti-emetic premedication regimen was adjusted with cycle 3.  Disposition-she continues to recover from treatment. The recent restaging PET scan shows remission. Dr. Cyndie Chime reviewed the PET scan images on the computer with Ms. Ney and her son. Dr. Cyndie Chime recommends restaging CT scans every 4 months for the first year, every 6 months year 2 and then on an as-needed basis.  She will return for labs and restaging CT scans in 4 months with an office visit approximately one week later to review the results with her.  She will contact the office in the interim with any problems.  Patient seen with Dr. Cyndie Chime.    Lonna Cobb ANP/GNP-BC   Hematology oncology attending: History physical and evaluation accurate as recorded above by nurse practitioner. I reviewed x-ray images with the patient and her son and outlined a followup plan. She has now completed 4 cycles of CHOP Rituxan chemotherapy with concomitant intrathecal methotrexate/hydrocortisone CNS prophylaxis and a course of consolidative radiation. She has achieved a complete PET response. We will now follow her periodically for any signs of recurrence.    Cephas Darby, MD, FACP  Hematology-Oncology

## 2013-07-09 NOTE — Telephone Encounter (Signed)
Gave pt appt for lab,md and CT on March 2014

## 2013-07-12 ENCOUNTER — Other Ambulatory Visit (HOSPITAL_COMMUNITY): Payer: Medicare Other

## 2013-07-12 ENCOUNTER — Other Ambulatory Visit: Payer: Self-pay | Admitting: Radiology

## 2013-07-13 ENCOUNTER — Encounter (HOSPITAL_COMMUNITY): Payer: Self-pay

## 2013-07-13 ENCOUNTER — Ambulatory Visit (HOSPITAL_COMMUNITY)
Admission: RE | Admit: 2013-07-13 | Discharge: 2013-07-13 | Disposition: A | Discharge status: Home / Self Care | Payer: Medicare Other | Source: Ambulatory Visit | Attending: Oncology | Admitting: Oncology

## 2013-07-13 ENCOUNTER — Encounter (HOSPITAL_COMMUNITY): Payer: Self-pay | Admitting: Pharmacy Technician

## 2013-07-13 ENCOUNTER — Ambulatory Visit (HOSPITAL_COMMUNITY)
Admission: RE | Admit: 2013-07-13 | Discharge: 2013-07-13 | Disposition: A | Discharge status: Home / Self Care | Payer: Medicare Other | Source: Ambulatory Visit | Attending: Nurse Practitioner | Admitting: Nurse Practitioner

## 2013-07-13 DIAGNOSIS — Z9221 Personal history of antineoplastic chemotherapy: Secondary | ICD-10-CM | POA: Insufficient documentation

## 2013-07-13 DIAGNOSIS — Z79899 Other long term (current) drug therapy: Secondary | ICD-10-CM | POA: Insufficient documentation

## 2013-07-13 DIAGNOSIS — C8510 Unspecified B-cell lymphoma, unspecified site: Secondary | ICD-10-CM

## 2013-07-13 DIAGNOSIS — I10 Essential (primary) hypertension: Secondary | ICD-10-CM | POA: Insufficient documentation

## 2013-07-13 DIAGNOSIS — E119 Type 2 diabetes mellitus without complications: Secondary | ICD-10-CM | POA: Insufficient documentation

## 2013-07-13 DIAGNOSIS — C8589 Other specified types of non-Hodgkin lymphoma, extranodal and solid organ sites: Secondary | ICD-10-CM | POA: Insufficient documentation

## 2013-07-13 DIAGNOSIS — Z452 Encounter for adjustment and management of vascular access device: Secondary | ICD-10-CM | POA: Insufficient documentation

## 2013-07-13 LAB — GLUCOSE, CAPILLARY
Glucose-Capillary: 80 mg/dL (ref 70–99)
Glucose-Capillary: 106 mg/dL — ABNORMAL HIGH (ref 70–99)
Glucose-Capillary: 87 mg/dL (ref 70–99)

## 2013-07-13 MED ORDER — MIDAZOLAM HCL 2 MG/2ML IJ SOLN
INTRAMUSCULAR | Status: AC | PRN
  Administered 2013-07-13: 2 mg via INTRAVENOUS

## 2013-07-13 MED ORDER — CEFAZOLIN SODIUM-DEXTROSE 2-3 GM-% IV SOLR
2.0000 g | INTRAVENOUS | Status: AC
  Administered 2013-07-13: 2 g via INTRAVENOUS
  Filled 2013-07-13: qty 50

## 2013-07-13 MED ORDER — FENTANYL CITRATE 0.05 MG/ML IJ SOLN
INTRAMUSCULAR | Status: AC | PRN
  Administered 2013-07-13: 100 ug via INTRAVENOUS

## 2013-07-13 MED ORDER — SODIUM CHLORIDE 0.9 % IV SOLN
INTRAVENOUS | Status: DC
  Administered 2013-07-13: 14:00:00 via INTRAVENOUS

## 2013-07-13 MED ORDER — MIDAZOLAM HCL 2 MG/2ML IJ SOLN
INTRAMUSCULAR | Status: AC
  Filled 2013-07-13: qty 4

## 2013-07-13 MED ORDER — FENTANYL CITRATE 0.05 MG/ML IJ SOLN
INTRAMUSCULAR | Status: AC
  Filled 2013-07-13: qty 4

## 2013-07-13 MED ORDER — DEXTROSE-NACL 5-0.45 % IV SOLN
INTRAVENOUS | Status: DC
  Administered 2013-07-13: 15:00:00 via INTRAVENOUS

## 2013-07-13 NOTE — Procedures (Signed)
Successful removal of right anterior chest wall port-a-cath. No immediate post procedural complications.  

## 2013-07-13 NOTE — H&P (Signed)
Marissa Clayton is an 67 y.o. female.   Chief Complaint: "I'm getting my port out" HPI: Patient with history of NHL, currently in clinical remission, presents today for port a cath removal.  Past Medical History  Diagnosis Date  . Diabetes mellitus without complication   . Hypertension   . History of cardiac catheterization 2011    heart cath clean per pt.  [name] COPD (chronic obstructive pulmonary disease)     per CXR last year per pt.  . Asthma   . Arthritis     hands  . Cancer   . Nausea with vomiting 03/05/2013    Past Surgical History  Procedure Laterality Date  . Cardiac catheterization  2011  . Tubal ligation      age 43    History reviewed. No pertinent family history. Social History:  reports that she has quit smoking. Her smoking use included Cigarettes. She smoked 0.00 packs per day. She does not have any smokeless tobacco history on file. She reports that she does not drink alcohol. Her drug history is not on file.  Allergies: No Known Allergies  Current outpatient prescriptions:acetaminophen (TYLENOL) 500 MG tablet, Take 1,000 mg by mouth every 6 (six) hours as needed for mild pain or moderate pain. , Disp: , Rfl: ;  albuterol (PROVENTIL HFA;VENTOLIN HFA) 108 (90 BASE) MCG/ACT inhaler, Inhale 2 puffs into the lungs every 6 (six) hours as needed for wheezing., Disp: , Rfl: ;  ALPRAZolam (XANAX) 0.5 MG tablet, Take 0.5 mg by mouth daily as needed for anxiety. , Disp: , Rfl:  amLODipine (NORVASC) 2.5 MG tablet, Take 2.5 mg by mouth every evening. , Disp: , Rfl: ;  aspirin EC 81 MG tablet, Take 81 mg by mouth every evening. , Disp: , Rfl: ;  Cholecalciferol (VITAMIN D) 2000 UNITS CAPS, Take 2,000 Units by mouth daily., Disp: , Rfl: ;  hydrochlorothiazide (HYDRODIURIL) 12.5 MG tablet, Take 12.5 mg by mouth every morning., Disp: , Rfl:  HYDROcodone-acetaminophen (NORCO/VICODIN) 5-325 MG per tablet, Take 1 tablet by mouth every 6 (six) hours as needed for pain. , Disp: , Rfl: ;   metFORMIN (GLUCOPHAGE) 500 MG tablet, Take 500 mg by mouth 2 (two) times daily with a meal. , Disp: , Rfl: ;  Multiple Vitamin (MULTIVITAMIN WITH MINERALS) TABS, Take 1 tablet by mouth daily., Disp: , Rfl: ;  pravastatin (PRAVACHOL) 40 MG tablet, Take 40 mg by mouth at bedtime., Disp: , Rfl:  lidocaine-prilocaine (EMLA) cream, Apply 1 application topically as needed. Add to port at least 1 hr before chemo, Disp: , Rfl: ;  LORazepam (ATIVAN) 1 MG tablet, Take 1 mg by mouth every 4 (four) hours as needed for anxiety or sleep. , Disp: , Rfl: ;  ondansetron (ZOFRAN-ODT) 8 MG disintegrating tablet, Take 8 mg by mouth every 6 (six) hours as needed for nausea. and vomiting., Disp: , Rfl:  predniSONE (DELTASONE) 20 MG tablet, Take 40-60 mg by mouth See admin instructions. Take 3 tablets after breakfast----then 2 after lunch for 5 days.  Start day after chemo., Disp: , Rfl:  Current facility-administered medications:0.9 %  sodium chloride infusion, , Intravenous, Continuous, D Kevin Dmauri Rosenow, PA-C;  ceFAZolin (ANCEF) IVPB 2 g/50 mL premix, 2 g, Intravenous, On Call, D Kevin Amad Mau, PA-C;  dextrose 5 %-0.45 % sodium chloride infusion, , Intravenous, Continuous, D Kevin Angala Hilgers, PA-C, Last Rate: 50 mL/hr at 07/13/13 1433;  fentaNYL (SUBLIMAZE) 0.05 MG/ML injection, , , , ;  midazolam (VERSED) 2 MG/2ML injection, , , ,  Facility-Administered Medications Ordered in Other Encounters: sodium chloride 0.9 % injection 10 mL, 10 mL, Intracatheter, PRN, Levert Feinstein, MD, 10 mL at 04/12/13 1318   Results for orders placed during the hospital encounter of 07/13/13 (from the past 48 hour(s))  PROTIME-INR     Status: None   Collection Time    07/13/13  1:55 PM      Result Value Range   Prothrombin Time 12.1  11.6 - 15.2 seconds   INR 0.91  0.00 - 1.49   No results found.  Review of Systems  Constitutional: Negative for fever and chills.  Respiratory: Negative for cough and shortness of breath.   Cardiovascular:  Negative for chest pain.  Gastrointestinal: Negative for nausea, vomiting and abdominal pain.  Musculoskeletal: Negative for back pain.  Neurological: Negative for headaches.  Endo/Heme/Allergies: Does not bruise/bleed easily.    Blood pressure 142/88, pulse 72, temperature 96.7 F (35.9 C), resp. rate 20, height 5\' 8"  (1.727 m), weight 162 lb (73.483 kg), SpO2 100.00%. Physical Exam  Constitutional: She is oriented to person, place, and time. She appears well-developed and well-nourished.  Cardiovascular: Normal rate and regular rhythm.   Respiratory: Effort normal and breath sounds normal.  Clean, intact rt chest wall PAC  GI: Soft. Bowel sounds are normal. There is no tenderness.  Musculoskeletal: Normal range of motion. She exhibits no edema.  Neurological: She is alert and oriented to person, place, and time.     Assessment/Plan Pt with hx of NHL, currently in clinical remission. Plan is for port a cath removal today. Details/risks of procedure d/w pt/daughter with their understanding and consent.  Tjay Velazquez,D KEVIN 07/13/2013, 3:20 PM

## 2013-07-13 NOTE — Progress Notes (Addendum)
Preprocedure in Short Stay. CBG is "80" pt states she feels fine and is asymptomatic. Sates she DID NOT take her Metformin today. This result was reported to Jeananne Rama PA Radiology. Orders to hang D5 .45 at [facility]

## 2013-08-23 ENCOUNTER — Telehealth: Payer: Self-pay | Admitting: *Deleted

## 2013-08-23 NOTE — Telephone Encounter (Signed)
Received vm call from pt asking for return call.  Returned call @ 1pm & pt reports that she had her last radiation tx in Oct & had mouth/tongue soreness but it went away.  She reports that it is back for @ 1 wk now & she went to the [facility] & he said he had never seen this happen before.  She reports no taste buds, tongue sensitive, hurts to swallow & feels like a film in her mouth. She reports no fever & mouth really doesn't look bad.  Everything taste salty.  She has tried magic mouthwash, biotene, warm salt water & she has been eating yogurt.  She reports nothing helps.  She wants to know if we have some suggestions. She reports no new meds/ATBs.  She also states that her weight is down to @ 150 & doesn't really want to keep losing. Will discuss with Lonna Cobb NP.

## 2013-08-24 ENCOUNTER — Telehealth: Payer: Self-pay | Admitting: Dietician

## 2013-08-24 ENCOUNTER — Telehealth: Payer: Self-pay | Admitting: *Deleted

## 2013-08-24 DIAGNOSIS — B370 Candidal stomatitis: Secondary | ICD-10-CM

## 2013-08-24 MED ORDER — FLUCONAZOLE 100 MG PO TABS: ORAL_TABLET | ORAL | Status: DC

## 2013-08-24 NOTE — Telephone Encounter (Signed)
Called pt & informed per Dr Cyndie Chime to drink Ensure or Boost to keep up her calories & she reports that she has been doing this.  Also per Dr. Cyndie Chime, this is not chemo related & hopefully will get better with time & could try diflucan to cover possibility of yeast infection.  She is reluctant to add any other meds & states she is not taking anything at this time & needs to go to her PCP to regulate her diabetes & BP meds.  She was encouraged to do this & informed that it would be worth a try to take the diflucan & might be a simple fix but shouldn't hurt her to take.  She states she will try it & script will be sent to her pharmacy.

## 2013-08-24 NOTE — Telephone Encounter (Signed)
Nutrition Brief Note  Patient identified on [facility] Nutrition Screen.   Wt Readings from Last 15 Encounters:  07/13/13 162 lb (73.483 kg)  07/09/13 162 lb 1.6 oz (73.528 kg)  05/04/13 173 lb (78.472 kg)  05/04/13 173 lb 3.2 oz (78.563 kg)  04/13/13 179 lb (81.194 kg)  04/05/13 178 lb 4.8 oz (80.876 kg)  03/15/13 178 lb 6.4 oz (80.922 kg)  03/02/13 168 lb (76.204 kg)  02/19/13 178 lb 11.2 oz (81.058 kg)  02/03/13 184 lb 3.2 oz (83.553 kg)  02/01/13 185 lb (83.915 kg)   Chart reviewed. Pt recently diagnosed with non-hodgkin's lymphoma s/p chemo and radiation. Appetite has been poor and pt complains of mouth soreness.   Wt hx reveals a 23# (12.5%) wt loss x 6 months (which is clinically significant and an 11# (6.3%) wt loss x 3 months.  Called pt at 1508, however, pt unavailable at time of call. Noted in telephone notes that pt is drinking Ensure. She was prescribed diflucan to help with yeast infection.   If further nutrition needs arise, please consult [facility] RD.   Marissa Clayton, RD, LDN Pager: [phone]

## 2013-11-10 ENCOUNTER — Ambulatory Visit (HOSPITAL_COMMUNITY)
Admission: RE | Admit: 2013-11-10 | Discharge: 2013-11-10 | Disposition: A | Discharge status: Home / Self Care | Payer: Medicare HMO | Source: Ambulatory Visit | Attending: Nurse Practitioner | Admitting: Nurse Practitioner

## 2013-11-10 ENCOUNTER — Other Ambulatory Visit (HOSPITAL_BASED_OUTPATIENT_CLINIC_OR_DEPARTMENT_OTHER): Payer: Medicare HMO

## 2013-11-10 DIAGNOSIS — C8589 Other specified types of non-Hodgkin lymphoma, extranodal and solid organ sites: Secondary | ICD-10-CM

## 2013-11-10 DIAGNOSIS — C8510 Unspecified B-cell lymphoma, unspecified site: Secondary | ICD-10-CM

## 2013-11-10 LAB — CBC WITH DIFFERENTIAL/PLATELET
BASO%: 0.9 % (ref 0.0–2.0)
BASOS ABS: 0 10*3/uL (ref 0.0–0.1)
EOS ABS: 0.2 10*3/uL (ref 0.0–0.5)
EOS%: 4 % (ref 0.0–7.0)
HEMATOCRIT: 37.2 % (ref 34.8–46.6)
HGB: 13 g/dL (ref 11.6–15.9)
LYMPH%: 30.2 % (ref 14.0–49.7)
MCH: 31 pg (ref 25.1–34.0)
MCHC: 34.8 g/dL (ref 31.5–36.0)
MCV: 89 fL (ref 79.5–101.0)
MONO#: 0.3 10*3/uL (ref 0.1–0.9)
MONO%: 8.8 % (ref 0.0–14.0)
NEUT#: 2.2 10*3/uL (ref 1.5–6.5)
NEUT%: 56.1 % (ref 38.4–76.8)
Platelets: 181 10*3/uL (ref 145–400)
RBC: 4.18 10*6/uL (ref 3.70–5.45)
RDW: 13.2 % (ref 11.2–14.5)
WBC: 3.9 10*3/uL (ref 3.9–10.3)
lymph#: 1.2 10*3/uL (ref 0.9–3.3)

## 2013-11-10 LAB — COMPREHENSIVE METABOLIC PANEL (CC13)
ALBUMIN: 4 g/dL (ref 3.5–5.0)
ALK PHOS: 72 U/L (ref 40–150)
ALT: 15 U/L (ref 0–55)
AST: 19 U/L (ref 5–34)
Anion Gap: 9 mEq/L (ref 3–11)
BUN: 14.3 mg/dL (ref 7.0–26.0)
CALCIUM: 9.7 mg/dL (ref 8.4–10.4)
CHLORIDE: 106 meq/L (ref 98–109)
CO2: 27 mEq/L (ref 22–29)
Creatinine: 0.8 mg/dL (ref 0.6–1.1)
GLUCOSE: 94 mg/dL (ref 70–140)
POTASSIUM: 3.9 meq/L (ref 3.5–5.1)
Sodium: 143 mEq/L (ref 136–145)
Total Bilirubin: 0.52 mg/dL (ref 0.20–1.20)
Total Protein: 6.6 g/dL (ref 6.4–8.3)

## 2013-11-10 LAB — LACTATE DEHYDROGENASE (CC13): LDH: 164 U/L (ref 125–245)

## 2013-11-10 MED ORDER — IOHEXOL 300 MG/ML  SOLN
100.0000 mL | Freq: Once | INTRAMUSCULAR | Status: AC | PRN
  Administered 2013-11-10: 100 mL via INTRAVENOUS

## 2013-11-12 ENCOUNTER — Ambulatory Visit (HOSPITAL_BASED_OUTPATIENT_CLINIC_OR_DEPARTMENT_OTHER): Payer: Medicare HMO | Admitting: Nurse Practitioner

## 2013-11-12 VITALS — BP 142/75 | HR 71 | Temp 97.6°F | Resp 18 | Ht 68.0 in | Wt 145.1 lb

## 2013-11-12 DIAGNOSIS — R1115 Cyclical vomiting syndrome unrelated to migraine: Secondary | ICD-10-CM

## 2013-11-12 DIAGNOSIS — R110 Nausea: Secondary | ICD-10-CM

## 2013-11-12 DIAGNOSIS — C8510 Unspecified B-cell lymphoma, unspecified site: Secondary | ICD-10-CM

## 2013-11-12 DIAGNOSIS — C8588 Other specified types of non-Hodgkin lymphoma, lymph nodes of multiple sites: Secondary | ICD-10-CM

## 2013-11-12 DIAGNOSIS — K5900 Constipation, unspecified: Secondary | ICD-10-CM

## 2013-11-12 NOTE — Progress Notes (Signed)
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 Staging bone marrow biopsy on 02/05/2013 was slightly hypercellular with trilineage hematopoiesis and no evidence for involvement with lymphoma. A lumbar puncture was done on on 02/05/2013. The cerebrospinal fluid showed no malignant cells.   She completed cycle #1 CHOP/Rituxan beginning 02/08/2013 (she began the prednisone portion of the treatment on 02/03/2013 for symptomatic relief). She completed cycle 2 CHOP/Rituxan on 03/01/2013. She completed the first prophylactic intrathecal methotrexate treatment on 03/02/2013. She had significant nausea/vomiting which eventually responded to Emend. She completed cycle 3 CHOP/Rituxan on 03/22/2013 and cycle 2 prophylactic intrathecal methotrexate on 03/23/2013. Restaging CT evaluation 04/05/2013 showed resolution of the posterior nasal pharyngeal mass. PET scan 04/09/2013 showed response to therapy of nasopharyngeal primary/lymphoma. There was mild residual hypermetabolism without CT correlate favored to be treatment-related. She completed cycle 4 CHOP/Rituxan on 04/12/2013 and cycle 3 prophylactic intrathecal methotrexate on 04/13/2013. She completed the fourth and final prophylactic intrathecal methotrexate on 05/04/2013. She was referred to radiation oncology and completed involved field radiation beginning 05/17/2013. Restaging PET scan 07/06/2013 showed some mild asymmetric metabolic activity within the right oropharynx, likely physiologic as there is no CT correlate.  She is seen today for scheduled followup. Her appetite is slowly improving. She had lost weight and is now regaining some of that. She denies any fevers or sweats. She has a good energy level. No shortness of breath or cough. Bowels moving  regularly. No hematuria or dysuria. No neck or ear pain.   Objective: Filed Vitals:   11/12/13 1041  BP: 142/75  Pulse: 71  Temp: 97.6 F (36.4 C)  Resp: 18   Oropharynx is without thrush or ulceration. No palpable cervical, supraclavicular, axillary or inguinal lymph nodes. Lungs are clear. No wheezes or rales. Regular cardiac rhythm. Abdomen soft and nontender. No organomegaly. No leg edema. Calves soft and nontender.   Lab Results: Lab Results  Component Value Date   WBC 3.9 11/10/2013   HGB 13.0 11/10/2013   HCT 37.2 11/10/2013   MCV 89.0 11/10/2013   PLT 181 11/10/2013   NEUTROABS 2.2 11/10/2013    Chemistry:    Chemistry      Component Value Date/Time   NA 143 11/10/2013 0852   NA 143 05/06/2011 0545   K 3.9 11/10/2013 0852   K 3.5 05/06/2011 0545   CL 103 02/03/2013 1002   CL 105 05/06/2011 0545   CO2 27 11/10/2013 0852   CO2 31 05/06/2011 0545   BUN 14.3 11/10/2013 0852   BUN 16 05/06/2011 0545   CREATININE 0.8 11/10/2013 0852   CREATININE 0.83 05/06/2011 0545      Component Value Date/Time   CALCIUM 9.7 11/10/2013 0852   CALCIUM 9.0 05/06/2011 0545   ALKPHOS 72 11/10/2013 0852   AST 19 11/10/2013 0852   ALT 15 11/10/2013 0852   BILITOT 0.52 11/10/2013 0852       Studies/Results: Ct Soft Tissue Neck W Contrast  11/10/2013   CLINICAL DATA:  Followup lymphoma  EXAM: CT CHEST WITH CONTRAST; CT NECK WITH CONTRAST  TECHNIQUE: Multidetector CT imaging of the neck was performed with intravenous contrast.; Multidetector CT imaging of the abdomen and pelvis was performed following the standard protocol during bolus administration of intravenous contrast.; Multidetector CT imaging of the chest was performed following the standard protocol during bolus  administration of intravenous contrast.  CONTRAST:  113m OMNIPAQUE IOHEXOL 300 MG/ML  SOLN  COMPARISON:  PET-CT 07/05/2013  FINDINGS: CT NECK FINDINGS  The visualized intracranial contents appear within normal limits. The paranasal  sinuses appear clear the mastoid air cells are clear. Review of the visualized osseous structures is significant for mild multilevel thoracic spondylosis. The parotid glands and the submandibular glands are normal and symmetric in appearance. Bilateral thyroid nodules are identified. The largest is in the left lobe measuring 8 mm, image 92/series 6. There are no enlarged lymph nodes within the neck.  CT CHEST FINDINGS  The heart size appears normal. No pericardial effusion. No enlarged mediastinal lymph nodes identified. There is no axillary or supraclavicular adenopathy identified. Incidental imaging through the upper abdomen shows no acute findings. Next  The lungs are clear. There is no pleural or pericardial stress set there is no pleural effusion. 4 mm left upper lobe nodule is identified, image 34/series 5. This is unchanged from previous exam.  Review of the visualized bony structures is on unremarkable. No aggressive lytic or sclerotic bone lesions.  IMPRESSION:  1. No evidence for residual or recurrent mass or adenopathy within the neck or chest.   Electronically Signed   By: TKerby MoorsM.D.   On: 11/10/2013 13:12   Ct Chest W Contrast  11/10/2013   CLINICAL DATA:  Followup lymphoma  EXAM: CT CHEST WITH CONTRAST; CT NECK WITH CONTRAST  TECHNIQUE: Multidetector CT imaging of the neck was performed with intravenous contrast.; Multidetector CT imaging of the abdomen and pelvis was performed following the standard protocol during bolus administration of intravenous contrast.; Multidetector CT imaging of the chest was performed following the standard protocol during bolus administration of intravenous contrast.  CONTRAST:  1066mOMNIPAQUE IOHEXOL 300 MG/ML  SOLN  COMPARISON:  PET-CT 07/05/2013  FINDINGS: CT NECK FINDINGS  The visualized intracranial contents appear within normal limits. The paranasal sinuses appear clear the mastoid air cells are clear. Review of the visualized osseous structures is  significant for mild multilevel thoracic spondylosis. The parotid glands and the submandibular glands are normal and symmetric in appearance. Bilateral thyroid nodules are identified. The largest is in the left lobe measuring 8 mm, image 92/series 6. There are no enlarged lymph nodes within the neck.  CT CHEST FINDINGS  The heart size appears normal. No pericardial effusion. No enlarged mediastinal lymph nodes identified. There is no axillary or supraclavicular adenopathy identified. Incidental imaging through the upper abdomen shows no acute findings. Next  The lungs are clear. There is no pleural or pericardial stress set there is no pleural effusion. 4 mm left upper lobe nodule is identified, image 34/series 5. This is unchanged from previous exam.  Review of the visualized bony structures is on unremarkable. No aggressive lytic or sclerotic bone lesions.  IMPRESSION:  1. No evidence for residual or recurrent mass or adenopathy within the neck or chest.   Electronically Signed   By: TaKerby Moors.D.   On: 11/10/2013 13:12    Medications: I have reviewed the patient's current medications.  Assessment/Plan: 1. High-grade B-cell non-Hodgkin's lymphoma involving the nasopharynx and level 3/4 cervical lymph nodes with evidence for local invasion into the clivus. Staging bone marrow biopsy negative for involvement with lymphoma. Cerebrospinal fluid negative as well. Status post cycle 1 CHOP/Rituxan 02/08/2013 (prednisone initiated 02/03/2013 for symptomatic relief). Status post cycle 2 CHOP/Rituxan 03/01/2013, cycle 3 03/22/2013 and cycle 4 04/12/2013. Status post cycle 1 prophylactic intrathecal methotrexate 03/02/2013, cycle 2  03/23/2013, cycle 3 04/13/2013 and cycle 4 05/04/2013. She completed involved field radiation beginning 05/17/2013. Restaging CT scans 11/10/2013 showed no evidence of lymphoma. 2. Right ear pain. Resolved. 3. Delayed nausea following cycle 1 CHOP/Rituxan. Aloxi was added beginning  with cycle 2. 4. Constipation following cycle 1 CHOP/Rituxan.  5. Hyperemesis following intrathecal methotrexate. The anti-emetic premedication regimen was adjusted with cycle 3.  Dispositon-Ms. Sheckell's appears stable. She is in remission from the non-Hodgkin's lymphoma. Dr. Beryle Beams recommends restaging CT scans at a four-month interval. She is aware that her care is being reassigned to Dr. Alvy Bimler. She will followup with Dr. Alvy Bimler about 1 week after the scans. She will contact the office in the interim with any problems.  Patient seen with Dr. Beryle Beams.   Ned Card ANP/GNP-BC

## 2013-11-15 ENCOUNTER — Telehealth: Payer: Self-pay | Admitting: Hematology and Oncology

## 2013-11-15 NOTE — Telephone Encounter (Signed)
[facility] FOR PT RE APPTS FOR 7/17 AND 7/20. [facility] WILL CALL RE SCAN APPTS. SCHEDULE MAILED.

## 2014-03-11 ENCOUNTER — Other Ambulatory Visit (HOSPITAL_BASED_OUTPATIENT_CLINIC_OR_DEPARTMENT_OTHER): Payer: Medicare HMO

## 2014-03-11 ENCOUNTER — Ambulatory Visit (HOSPITAL_COMMUNITY)
Admission: RE | Admit: 2014-03-11 | Discharge: 2014-03-11 | Disposition: A | Discharge status: Home / Self Care | Payer: Medicare HMO | Source: Ambulatory Visit | Attending: Nurse Practitioner | Admitting: Nurse Practitioner

## 2014-03-11 DIAGNOSIS — C8588 Other specified types of non-Hodgkin lymphoma, lymph nodes of multiple sites: Secondary | ICD-10-CM

## 2014-03-11 DIAGNOSIS — C8510 Unspecified B-cell lymphoma, unspecified site: Secondary | ICD-10-CM

## 2014-03-11 DIAGNOSIS — C8589 Other specified types of non-Hodgkin lymphoma, extranodal and solid organ sites: Secondary | ICD-10-CM | POA: Insufficient documentation

## 2014-03-11 LAB — CBC WITH DIFFERENTIAL/PLATELET
BASO%: 1.1 % (ref 0.0–2.0)
Basophils Absolute: 0 10*3/uL (ref 0.0–0.1)
EOS ABS: 0.2 10*3/uL (ref 0.0–0.5)
EOS%: 3.9 % (ref 0.0–7.0)
HCT: 39.3 % (ref 34.8–46.6)
HGB: 13.5 g/dL (ref 11.6–15.9)
LYMPH#: 1.3 10*3/uL (ref 0.9–3.3)
LYMPH%: 30.3 % (ref 14.0–49.7)
MCH: 30.6 pg (ref 25.1–34.0)
MCHC: 34.4 g/dL (ref 31.5–36.0)
MCV: 89.1 fL (ref 79.5–101.0)
MONO#: 0.3 10*3/uL (ref 0.1–0.9)
MONO%: 8.3 % (ref 0.0–14.0)
NEUT%: 56.4 % (ref 38.4–76.8)
NEUTROS ABS: 2.4 10*3/uL (ref 1.5–6.5)
Platelets: 164 10*3/uL (ref 145–400)
RBC: 4.41 10*6/uL (ref 3.70–5.45)
RDW: 12.7 % (ref 11.2–14.5)
WBC: 4.2 10*3/uL (ref 3.9–10.3)

## 2014-03-11 LAB — COMPREHENSIVE METABOLIC PANEL (CC13)
ALBUMIN: 3.8 g/dL (ref 3.5–5.0)
ALT: 13 U/L (ref 0–55)
ANION GAP: 8 meq/L (ref 3–11)
AST: 18 U/L (ref 5–34)
Alkaline Phosphatase: 75 U/L (ref 40–150)
BUN: 14.4 mg/dL (ref 7.0–26.0)
CO2: 27 mEq/L (ref 22–29)
Calcium: 9.4 mg/dL (ref 8.4–10.4)
Chloride: 107 mEq/L (ref 98–109)
Creatinine: 0.8 mg/dL (ref 0.6–1.1)
GLUCOSE: 92 mg/dL (ref 70–140)
POTASSIUM: 4.2 meq/L (ref 3.5–5.1)
Sodium: 143 mEq/L (ref 136–145)
TOTAL PROTEIN: 6.5 g/dL (ref 6.4–8.3)
Total Bilirubin: 0.65 mg/dL (ref 0.20–1.20)

## 2014-03-11 LAB — LACTATE DEHYDROGENASE (CC13): LDH: 166 U/L (ref 125–245)

## 2014-03-11 MED ORDER — IOHEXOL 300 MG/ML  SOLN
100.0000 mL | Freq: Once | INTRAMUSCULAR | Status: AC | PRN
  Administered 2014-03-11: 100 mL via INTRAVENOUS

## 2014-03-14 ENCOUNTER — Encounter: Payer: Self-pay | Admitting: Hematology and Oncology

## 2014-03-14 ENCOUNTER — Telehealth: Payer: Self-pay | Admitting: Hematology and Oncology

## 2014-03-14 ENCOUNTER — Ambulatory Visit (HOSPITAL_BASED_OUTPATIENT_CLINIC_OR_DEPARTMENT_OTHER): Payer: Medicare HMO | Admitting: Hematology and Oncology

## 2014-03-14 VITALS — BP 154/82 | HR 62 | Temp 98.2°F | Resp 18 | Ht 68.0 in | Wt 143.0 lb

## 2014-03-14 DIAGNOSIS — C8510 Unspecified B-cell lymphoma, unspecified site: Secondary | ICD-10-CM

## 2014-03-14 DIAGNOSIS — C8581 Other specified types of non-Hodgkin lymphoma, lymph nodes of head, face, and neck: Secondary | ICD-10-CM

## 2014-03-14 NOTE — Telephone Encounter (Signed)
[facility] advising the pt of her jan 2016 appts.

## 2014-03-14 NOTE — Assessment & Plan Note (Signed)
Clinically, she has no evidence of disease. The patient had early stage disease that was treated aggressively with both chemotherapy followed by radiation treatment. I recommend history, physical examination and blood work every 6 months. She will continue ENT followup and radiation oncologist in between visits as well. The patient is educated about signs and symptoms to watch out for disease recurrence. Due to her immunocompromised state, I recommend yearly influenza vaccination.

## 2014-03-14 NOTE — Progress Notes (Signed)
[facility] FOLLOW-UP progress notes  Patient Care Team: Gatha Mayer, MD as PCP - General (Radiation Oncology) Celedonio Savage, MD as PCP - Family Medicine (Family Medicine) Flossie Buffy. 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  HISTORY OF PRESENTING ILLNESS:  Marissa Clayton 68 y.o. female was transferred to my care after her prior physician has left.  I reviewed the patient's records extensive and collaborated the history with the patient. Summary of her history is as follows: This patient was originally diagnosed last summer after presentation with sinus drainage and ear pain. 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 Staging bone marrow biopsy was negative. Cerebrospinal fluid from lumbar puncture was negative. The patient received 4 cycles of R-CHOP chemotherapy along with 3 cycles of prophylactic intrathecal methotrexate and had completed most of her treatment by 05/04/2013. She underwent involved field radiation therapy afterwards. Her last PET scan from November 2014 was negative for recurrence. From her treatment, she has lost 50 pounds of weight and went off all her medications for diabetes. She complained of persistent intermittent neck pain, altered taste sensation, and persistent dry mouth. She also had poor dentition requiring partial replacement of her teeth. She denies new lymphadenopathy.   MEDICAL HISTORY:  Past Medical History  Diagnosis Date  . Diabetes mellitus without complication   . Hypertension   . History of cardiac catheterization 2011    heart cath clean per pt.  [name] COPD (chronic obstructive pulmonary disease)     per CXR  last year per pt.  . Asthma   . Arthritis     hands  . Cancer   . Nausea with vomiting 03/05/2013    SURGICAL HISTORY: Past Surgical History  Procedure Laterality Date  . Cardiac catheterization  2011  . Tubal ligation      age 44    SOCIAL HISTORY: History   Social History  . Marital Status: Widowed    Spouse Name: N/A    Number of Children: N/A  . Years of Education: N/A   Occupational History  . Not on file.   Social History Main Topics  . Smoking status: Former Smoker    Types: Cigarettes  . Smokeless tobacco: Never Used  . Alcohol Use: No  . Drug Use: No  . Sexual Activity: Not on file   Other Topics Concern  . Not on file   Social History Narrative  . No narrative on file    FAMILY HISTORY: Family History  Problem Relation Age of Onset  . Cancer Mother     oral ca  . Cancer Father     lung ca  . Cancer Brother     ALLERGIES:  has No Known Allergies.  MEDICATIONS:  Current Outpatient Prescriptions  Medication Sig Dispense Refill  . ACCU-CHEK AVIVA PLUS test strip       . ACCU-CHEK SOFTCLIX LANCETS lancets       . acetaminophen (TYLENOL) 500 MG tablet Take 1,000 mg by mouth every 6 (six) hours as needed for mild pain or moderate pain.       [name] albuterol (PROVENTIL HFA;VENTOLIN HFA) 108 (90 BASE) MCG/ACT inhaler Inhale 2 puffs into the lungs every 6 (six) hours as needed for wheezing.      [name]  ALPRAZolam (XANAX) 0.5 MG tablet Take 0.5 mg by mouth daily as needed for anxiety.       [name] amLODipine (NORVASC) 2.5 MG tablet Take 2.5 mg by mouth every evening.       [name] aspirin EC 81 MG tablet Take 81 mg by mouth every evening.       . Cholecalciferol (VITAMIN D) 2000 UNITS CAPS Take 2,000 Units by mouth daily.      . hydrochlorothiazide (HYDRODIURIL) 12.5 MG tablet Take 12.5 mg by mouth every morning.      [name] HYDROcodone-acetaminophen (NORCO/VICODIN) 5-325 MG per tablet Take 1 tablet by mouth every 6 (six) hours as needed for pain.       [name] lidocaine-prilocaine  (EMLA) cream Apply 1 application topically as needed. Add to port at least 1 hr before chemo      . LORazepam (ATIVAN) 1 MG tablet Take 1 mg by mouth every 4 (four) hours as needed for anxiety or sleep.       . Multiple Vitamin (MULTIVITAMIN WITH MINERALS) TABS Take 1 tablet by mouth daily.      . pravastatin (PRAVACHOL) 40 MG tablet Take 40 mg by mouth at bedtime.       No current facility-administered medications for this visit.   Facility-Administered Medications Ordered in Other Visits  Medication Dose Route Frequency Provider Last Rate Last Dose  . sodium chloride 0.9 % injection 10 mL  10 mL Intracatheter PRN Annia Belt, MD   10 mL at 04/12/13 1318    REVIEW OF SYSTEMS:   Constitutional: Denies fevers, chills or abnormal night sweats Eyes: Denies blurriness of vision, double vision or watery eyes Ears, nose, mouth, throat, and face: Denies mucositis or sore throat Respiratory: Denies cough, dyspnea or wheezes Cardiovascular: Denies palpitation, chest discomfort or lower extremity swelling Gastrointestinal:  Denies nausea, heartburn or change in bowel habits Skin: Denies abnormal skin rashes Lymphatics: Denies new lymphadenopathy or easy bruising Neurological:Denies numbness, tingling or new weaknesses Behavioral/Psych: Mood is stable, no new changes  All other systems were reviewed with the patient and are negative.  PHYSICAL EXAMINATION: ECOG PERFORMANCE STATUS: 0 - Asymptomatic  Filed Vitals:   03/14/14 1203  BP: 154/82  Pulse: 62  Temp: 98.2 F (36.8 C)  Resp: 18   Filed Weights   03/14/14 1203  Weight: 143 lb (64.864 kg)    GENERAL:alert, no distress and comfortable SKIN: skin color, texture, turgor are normal, no rashes or significant lesions EYES: normal, conjunctiva are pink and non-injected, sclera clear OROPHARYNX:no exudate, normal lips, buccal mucosa, and tongue  NECK: supple, thyroid normal size, non-tender, without nodularity LYMPH:  no  palpable lymphadenopathy in the cervical, axillary or inguinal LUNGS: clear to auscultation and percussion with normal breathing effort HEART: regular rate & rhythm and no murmurs without lower extremity edema ABDOMEN:abdomen soft, non-tender and normal bowel sounds Musculoskeletal:no cyanosis of digits and no clubbing  PSYCH: alert & oriented x 3 with fluent speech NEURO: no focal motor/sensory deficits  LABORATORY DATA:  I have reviewed the data as listed Lab Results  Component Value Date   WBC 4.2 03/11/2014   HGB 13.5 03/11/2014   HCT 39.3 03/11/2014   MCV 89.1 03/11/2014   PLT 164 03/11/2014    Recent Labs  07/05/13 0931 11/10/13 0852 03/11/14 0908  NA 142 143 143  K 3.7 3.9 4.2  CO2 _0 GLUCOSE 103 94 92  BUN 10.7 14.3 14.4  CREATININE 0.8 0.8 0.8  CALCIUM 10.0  9.7 9.4  PROT 6.9 6.6 6.5  ALBUMIN 4.1 4.0 3.8  AST _0 ALT _1 ALKPHOS 66 72 75  BILITOT 0.68 0.52 0.65    RADIOGRAPHIC STUDIES: I reviewed the imaging study with the patient's. I have personally reviewed the radiological images as listed and agreed with the findings in the report. Ct Soft Tissue Neck W Contrast  03/11/2014   CLINICAL DATA:  Non-Hodgkin's lymphoma of the sinus. Status post chemotherapy.  EXAM: CT NECK WITH CONTRAST  TECHNIQUE: Multidetector CT imaging of the neck was performed using the standard protocol following the bolus administration of intravenous contrast.  CONTRAST:  156m OMNIPAQUE IOHEXOL 300 MG/ML  SOLN  COMPARISON:  PET scan 07/05/2013 and 04/09/2013.  FINDINGS: The suprahyoid neck demonstrates no focal mucosal or submucosal lesions. The tongue base scratch the intrinsic tongue muscles are within normal limits. Salivary glands are normal bilaterally.  Slight asymmetry of tissue in the posterior right nasopharynx is stable.  The larynx is within normal limits. Vocal cords are midline and symmetric.  The infraglottic neck demonstrates multiple left-sided thyroid nodules  with enlargement of left side of the gland. The largest lesion is 12 mm. Several other lesions are similar in size. The superior mediastinum is within normal limits.  The lung apices are clear.  No significant cervical adenopathy is present.  Degenerative changes are noted in the cervical spine with mild anterolisthesis at C3-4 and C4-5. Chronic endplate scratch the there is chronic loss of disc space at C5-6 and C6-7. No focal lytic or blastic lesions are present. The patient is edentulous.  IMPRESSION: 1. Stable mild asymmetry of soft tissue in the right nasopharynx without evidence for focal lesion. 2. No other evidence for residual or recurrent tumor. 3. Mild degenerative changes in the cervical spine. 4. No significant adenopathy. 5. Stable appearance of multiple thyroid nodules, particularly on the left.   Electronically Signed   By: CLawrence SantiagoM.D.   On: 03/11/2014 12:08   Ct Chest W Contrast  03/11/2014   CLINICAL DATA:  High-grade B-cell lymphoma.  EXAM: CT CHEST WITH CONTRAST  TECHNIQUE: Multidetector CT imaging of the chest was performed during intravenous contrast administration.  CONTRAST:  1035mOMNIPAQUE IOHEXOL 300 MG/ML  SOLN  COMPARISON:  11/10/2013.  FINDINGS: Soft tissue / Mediastinum: 11 mm nodule in the left thyroid lobe is stable. There is no axillary lymphadenopathy. No mediastinal or hilar lymphadenopathy. Small lymph nodes posterior to the esophagus are unchanged. Heart size is normal. Coronary artery calcification is noted. No pericardial effusion.  Lungs / Pleura: There is no focal airspace consolidation. No pulmonary edema. 4 mm left upper lobe pulmonary nodule is unchanged. No new pulmonary nodule or mass. No evidence for pleural effusion.  Bones: Bone windows reveal no worrisome lytic or sclerotic osseous lesions.  Upper Abdomen:  Unremarkable.  IMPRESSION: Stable exam.  No lymphadenopathy in the chest.   Electronically Signed   By: ErMisty Stanley.D.   On: 03/11/2014 14:00     ASSESSMENT & PLAN:  High grade B-cell lymphoma Clinically, she has no evidence of disease. The patient had early stage disease that was treated aggressively with both chemotherapy followed by radiation treatment. I recommend history, physical examination and blood work every 6 months. She will continue ENT followup and radiation oncologist in between visits as well. The patient is educated about signs and symptoms to watch out for disease recurrence. Due to her immunocompromised state, I recommend yearly influenza vaccination.  Orders Placed This Encounter  Procedures  . CBC with Differential    Standing Status: Future     Number of Occurrences:      Standing Expiration Date: 03/14/2015  . Comprehensive metabolic panel    Standing Status: Future     Number of Occurrences:      Standing Expiration Date: 03/14/2015  . Lactate dehydrogenase    Standing Status: Future     Number of Occurrences:      Standing Expiration Date: 03/14/2015    All questions were answered. The patient knows to call the clinic with any problems, questions or concerns. I spent 25 minutes counseling the patient face to face. The total time spent in the appointment was 30 minutes and more than 50% was on counseling.     [facility], Zymir Napoli, MD 03/14/2014 12:27 PM

## 2014-09-20 ENCOUNTER — Ambulatory Visit (HOSPITAL_BASED_OUTPATIENT_CLINIC_OR_DEPARTMENT_OTHER): Payer: Medicare HMO | Admitting: Hematology and Oncology

## 2014-09-20 ENCOUNTER — Other Ambulatory Visit (HOSPITAL_BASED_OUTPATIENT_CLINIC_OR_DEPARTMENT_OTHER): Payer: Medicare HMO

## 2014-09-20 ENCOUNTER — Encounter: Payer: Self-pay | Admitting: Hematology and Oncology

## 2014-09-20 ENCOUNTER — Telehealth: Payer: Self-pay | Admitting: Hematology and Oncology

## 2014-09-20 VITALS — BP 173/84 | HR 65 | Temp 97.5°F | Resp 20 | Ht 68.0 in | Wt 150.9 lb

## 2014-09-20 DIAGNOSIS — C8510 Unspecified B-cell lymphoma, unspecified site: Secondary | ICD-10-CM

## 2014-09-20 DIAGNOSIS — C8331 Diffuse large B-cell lymphoma, lymph nodes of head, face, and neck: Secondary | ICD-10-CM

## 2014-09-20 DIAGNOSIS — I10 Essential (primary) hypertension: Secondary | ICD-10-CM

## 2014-09-20 LAB — LACTATE DEHYDROGENASE (CC13): LDH: 185 U/L (ref 125–245)

## 2014-09-20 LAB — CBC WITH DIFFERENTIAL/PLATELET
BASO%: 0.9 % (ref 0.0–2.0)
BASOS ABS: 0 10*3/uL (ref 0.0–0.1)
EOS%: 7.6 % — AB (ref 0.0–7.0)
Eosinophils Absolute: 0.3 10*3/uL (ref 0.0–0.5)
HCT: 39.8 % (ref 34.8–46.6)
HEMOGLOBIN: 13.8 g/dL (ref 11.6–15.9)
LYMPH%: 32.3 % (ref 14.0–49.7)
MCH: 30.3 pg (ref 25.1–34.0)
MCHC: 34.7 g/dL (ref 31.5–36.0)
MCV: 87.3 fL (ref 79.5–101.0)
MONO#: 0.4 10*3/uL (ref 0.1–0.9)
MONO%: 8.1 % (ref 0.0–14.0)
NEUT#: 2.3 10*3/uL (ref 1.5–6.5)
NEUT%: 51.1 % (ref 38.4–76.8)
Platelets: 153 10*3/uL (ref 145–400)
RBC: 4.56 10*6/uL (ref 3.70–5.45)
RDW: 12.4 % (ref 11.2–14.5)
WBC: 4.5 10*3/uL (ref 3.9–10.3)
lymph#: 1.4 10*3/uL (ref 0.9–3.3)

## 2014-09-20 LAB — COMPREHENSIVE METABOLIC PANEL (CC13)
ALBUMIN: 4.1 g/dL (ref 3.5–5.0)
ALK PHOS: 79 U/L (ref 40–150)
ALT: 16 U/L (ref 0–55)
ANION GAP: 8 meq/L (ref 3–11)
AST: 21 U/L (ref 5–34)
BILIRUBIN TOTAL: 0.63 mg/dL (ref 0.20–1.20)
BUN: 11.3 mg/dL (ref 7.0–26.0)
CHLORIDE: 105 meq/L (ref 98–109)
CO2: 29 mEq/L (ref 22–29)
Calcium: 9.1 mg/dL (ref 8.4–10.4)
Creatinine: 0.8 mg/dL (ref 0.6–1.1)
EGFR: 71 mL/min/{1.73_m2} — AB (ref >90)
GLUCOSE: 107 mg/dL (ref 70–140)
Potassium: 4 mEq/L (ref 3.5–5.1)
Sodium: 142 mEq/L (ref 136–145)
TOTAL PROTEIN: 6.7 g/dL (ref 6.4–8.3)

## 2014-09-20 NOTE — Assessment & Plan Note (Signed)
I am concerned about her sensation of fluttering in her ear. I recommend CT scan to evaluate to exclude recurrence and she agreed to proceed.

## 2014-09-20 NOTE — Telephone Encounter (Signed)
gv and printed appt sched and avs for pt for February

## 2014-09-20 NOTE — Assessment & Plan Note (Signed)
She was started on a blood pressure medication by her PCP but is afraid to take it because of fear of hypotension. I recommend the patient to take at nighttime.

## 2014-09-20 NOTE — Progress Notes (Signed)
[place] OFFICE PROGRESS NOTE  Patient Care Team: Gatha Mayer, MD as PCP - General (Radiation Oncology) Celedonio Savage, MD as PCP - Family Medicine (Family Medicine) Flossie Buffy. 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  HISTORY OF PRESENTING ILLNESS:  Marissa Clayton 69 y.o. female was transferred to my care after her prior physician has left.  I reviewed the patient's records extensive and collaborated the history with the patient. Summary of her history is as follows: This patient was originally diagnosed last summer after presentation with sinus drainage and ear pain. 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 Staging bone marrow biopsy was negative. Cerebrospinal fluid from lumbar puncture was negative. The patient received 4 cycles of R-CHOP chemotherapy along with 3 cycles of prophylactic intrathecal methotrexate and had completed most of her treatment by 05/04/2013. She underwent involved field radiation therapy afterwards. Her last PET scan from November 2014 was negative for recurrence. From her treatment, she has lost 50 pounds of weight and went off all her medications for diabetes.  INTERVAL HISTORY: Please see below for problem oriented charting. She complained of persistent intermittent neck pain, altered taste sensation, and persistent dry mouth. She also had poor dentition requiring partial replacement of her teeth. She denies new lymphadenopathy. She complained of new sensation of fluttering in the right ear. Her last ENT exam was negative in December. She denies hearing loss. REVIEW OF SYSTEMS:   Constitutional: Denies fevers, chills  or abnormal weight loss Eyes: Denies blurriness of vision Ears, nose, mouth, throat, and face: Denies mucositis or sore throat Respiratory: Denies cough, dyspnea or wheezes Cardiovascular: Denies palpitation, chest discomfort or lower extremity swelling Gastrointestinal:  Denies nausea, heartburn or change in bowel habits Skin: Denies abnormal skin rashes Lymphatics: Denies new lymphadenopathy or easy bruising Neurological:Denies numbness, tingling or new weaknesses Behavioral/Psych: Mood is stable, no new changes  All other systems were reviewed with the patient and are negative.  I have reviewed the past medical history, past surgical history, social history and family history with the patient and they are unchanged from previous note.  ALLERGIES:  has No Known Allergies.  MEDICATIONS:  Current Outpatient Prescriptions  Medication Sig Dispense Refill  . Cholecalciferol (VITAMIN D) 2000 UNITS CAPS Take 2,000 Units by mouth daily.    . Multiple Vitamin (MULTIVITAMIN WITH MINERALS) TABS Take 1 tablet by mouth daily.     No current facility-administered medications for this visit.   Facility-Administered Medications Ordered in Other Visits  Medication Dose Route Frequency Provider Last Rate Last Dose  . sodium chloride 0.9 % injection 10 mL  10 mL Intracatheter PRN Annia Belt, MD   10 mL at 04/12/13 1318    PHYSICAL EXAMINATION: ECOG PERFORMANCE STATUS: 0 - Asymptomatic  Filed Vitals:   09/20/14 0906  BP: 173/84  Pulse: 65  Temp: 97.5 F (36.4 C)  Resp: 20   Filed Weights   09/20/14 0906  Weight: 150 lb 14.4 oz (68.448 kg)    GENERAL:alert, no distress and comfortable SKIN: skin color, texture, turgor are normal, no rashes or significant lesions EYES: normal, Conjunctiva are pink and non-injected, sclera clear OROPHARYNX:no exudate, no erythema and lips, buccal mucosa, and tongue normal  NECK: supple, thyroid normal size, non-tender,  without nodularity LYMPH:   no palpable lymphadenopathy in the cervical, axillary or inguinal LUNGS: clear to auscultation and percussion with normal breathing effort HEART: regular rate & rhythm and no murmurs and no lower extremity edema ABDOMEN:abdomen soft, non-tender and normal bowel sounds Musculoskeletal:no cyanosis of digits and no clubbing  NEURO: alert & oriented x 3 with fluent speech, no focal motor/sensory deficits  LABORATORY DATA:  I have reviewed the data as listed    Component Value Date/Time   NA 142 09/20/2014 0850   NA 143 05/06/2011 0545   K 4.0 09/20/2014 0850   K 3.5 05/06/2011 0545   CL 103 02/03/2013 1002   CL 105 05/06/2011 0545   CO2 29 09/20/2014 0850   CO2 31 05/06/2011 0545   GLUCOSE 107 09/20/2014 0850   GLUCOSE 114* 02/03/2013 1002   GLUCOSE 121* 05/06/2011 0545   BUN 11.3 09/20/2014 0850   BUN 16 05/06/2011 0545   CREATININE 0.8 09/20/2014 0850   CREATININE 0.83 05/06/2011 0545   CALCIUM 9.1 09/20/2014 0850   CALCIUM 9.0 05/06/2011 0545   PROT 6.7 09/20/2014 0850   ALBUMIN 4.1 09/20/2014 0850   AST 21 09/20/2014 0850   ALT 16 09/20/2014 0850   ALKPHOS 79 09/20/2014 0850   BILITOT 0.63 09/20/2014 0850   GFRNONAA >60 05/06/2011 0545   GFRAA >60 05/06/2011 0545    No results found for: SPEP, UPEP  Lab Results  Component Value Date   WBC 4.5 09/20/2014   NEUTROABS 2.3 09/20/2014   HGB 13.8 09/20/2014   HCT 39.8 09/20/2014   MCV 87.3 09/20/2014   PLT 153 09/20/2014      Chemistry      Component Value Date/Time   NA 142 09/20/2014 0850   NA 143 05/06/2011 0545   K 4.0 09/20/2014 0850   K 3.5 05/06/2011 0545   CL 103 02/03/2013 1002   CL 105 05/06/2011 0545   CO2 29 09/20/2014 0850   CO2 31 05/06/2011 0545   BUN 11.3 09/20/2014 0850   BUN 16 05/06/2011 0545   CREATININE 0.8 09/20/2014 0850   CREATININE 0.83 05/06/2011 0545      Component Value Date/Time   CALCIUM 9.1 09/20/2014 0850   CALCIUM 9.0 05/06/2011 0545   ALKPHOS 79 09/20/2014 0850   AST 21  09/20/2014 0850   ALT 16 09/20/2014 0850   BILITOT 0.63 09/20/2014 0850      ASSESSMENT & PLAN:  High grade B-cell lymphoma I am concerned about her sensation of fluttering in her ear. I recommend CT scan to evaluate to exclude recurrence and she agreed to proceed.   Essential hypertension She was started on a blood pressure medication by her PCP but is afraid to take it because of fear of hypotension. I recommend the patient to take at nighttime.    Orders Placed This Encounter  Procedures  . CT Soft Tissue Neck W Contrast    Standing Status: Future     Number of Occurrences:      Standing Expiration Date: 12/21/2015    Order Specific Question:  Reason for Exam (SYMPTOM  OR DIAGNOSIS REQUIRED)    Answer:  nasopharyngeal lymphoma history, exclude recurrence    Order Specific Question:  Preferred imaging location?    Answer:  [facility]   All questions were answered. The patient knows to call the clinic with any problems, questions or concerns. No barriers to learning was detected. I spent 25 minutes counseling the patient face to face. The total  time spent in the appointment was 30 minutes and more than 50% was on counseling and review of test results     [facility], [place], MD 09/20/2014 11:37 AM

## 2014-09-20 NOTE — Telephone Encounter (Signed)
gv adn printed appt sched anda vs for pt for Feb °

## 2014-09-23 ENCOUNTER — Encounter (HOSPITAL_COMMUNITY): Payer: Self-pay

## 2014-09-23 ENCOUNTER — Ambulatory Visit (HOSPITAL_COMMUNITY)
Admission: RE | Admit: 2014-09-23 | Discharge: 2014-09-23 | Disposition: A | Discharge status: Home / Self Care | Payer: Medicare HMO | Source: Ambulatory Visit | Attending: Hematology and Oncology | Admitting: Hematology and Oncology

## 2014-09-23 DIAGNOSIS — C8510 Unspecified B-cell lymphoma, unspecified site: Secondary | ICD-10-CM | POA: Insufficient documentation

## 2014-09-23 MED ORDER — IOHEXOL 300 MG/ML  SOLN
100.0000 mL | Freq: Once | INTRAMUSCULAR | Status: AC | PRN
  Administered 2014-09-23: 100 mL via INTRAVENOUS

## 2014-09-26 ENCOUNTER — Ambulatory Visit (HOSPITAL_BASED_OUTPATIENT_CLINIC_OR_DEPARTMENT_OTHER): Payer: Medicare HMO | Admitting: Hematology and Oncology

## 2014-09-26 ENCOUNTER — Encounter: Payer: Self-pay | Admitting: Hematology and Oncology

## 2014-09-26 VITALS — BP 147/83 | HR 68 | Temp 97.7°F | Resp 18 | Ht 68.0 in | Wt 154.6 lb

## 2014-09-26 DIAGNOSIS — C8510 Unspecified B-cell lymphoma, unspecified site: Secondary | ICD-10-CM

## 2014-09-26 DIAGNOSIS — C8331 Diffuse large B-cell lymphoma, lymph nodes of head, face, and neck: Secondary | ICD-10-CM

## 2014-09-26 NOTE — Progress Notes (Signed)
[facility] OFFICE PROGRESS NOTE  Patient Care Team: Gatha Mayer, MD as PCP - General (Radiation Oncology) Celedonio Savage, MD as PCP - Family Medicine (Family Medicine) Flossie Buffy. Redmond Pulling, MD as Referring Physician (Otolaryngology) Heath Lark, MD as Consulting Physician (Hematology and Oncology)  SUMMARY OF ONCOLOGIC HISTORY:   High grade B-cell lymphoma   01/31/2013 Initial Diagnosis High grade B-cell lymphoma   09/23/2014 Imaging Repeat CT scan showed no evidence of disease   I reviewed the patient's records extensive and collaborated the history with the patient. Summary of her history is as follows: This patient was originally diagnosed last summer after presentation with sinus drainage and ear pain. 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 Staging bone marrow biopsy was negative. Cerebrospinal fluid from lumbar puncture was negative. The patient received 4 cycles of R-CHOP chemotherapy along with 3 cycles of prophylactic intrathecal methotrexate and had completed most of her treatment by 05/04/2013. She underwent involved field radiation therapy afterwards. Her last PET scan from November 2014 was negative for recurrence. From her treatment, she has lost 50 pounds of weight and went off all her medications for diabetes.  INTERVAL HISTORY: Please see below for problem oriented charting. Her symptoms of changes in her hearing has resolved. She returns today to review test results.  REVIEW OF SYSTEMS:   Constitutional: Denies fevers, chills or abnormal weight loss Eyes: Denies blurriness of vision Ears, nose, mouth, throat, and face: Denies mucositis or sore throat Respiratory: Denies cough, dyspnea or wheezes Cardiovascular: Denies palpitation, chest discomfort or lower extremity swelling Gastrointestinal:  Denies nausea, heartburn or change in  bowel habits Skin: Denies abnormal skin rashes Lymphatics: Denies new lymphadenopathy or easy bruising Neurological:Denies numbness, tingling or new weaknesses Behavioral/Psych: Mood is stable, no new changes  All other systems were reviewed with the patient and are negative.  I have reviewed the past medical history, past surgical history, social history and family history with the patient and they are unchanged from previous note.  ALLERGIES:  has No Known Allergies.  MEDICATIONS:  Current Outpatient Prescriptions  Medication Sig Dispense Refill  . Cholecalciferol (VITAMIN D) 2000 UNITS CAPS Take 2,000 Units by mouth daily.    . Multiple Vitamin (MULTIVITAMIN WITH MINERALS) TABS Take 1 tablet by mouth daily.     No current facility-administered medications for this visit.   Facility-Administered Medications Ordered in Other Visits  Medication Dose Route Frequency Provider Last Rate Last Dose  . sodium chloride 0.9 % injection 10 mL  10 mL Intracatheter PRN Annia Belt, MD   10 mL at 04/12/13 1318    PHYSICAL EXAMINATION: ECOG PERFORMANCE STATUS: 0 - Asymptomatic  Filed Vitals:   09/26/14 1211  BP: 147/83  Pulse: 68  Temp: 97.7 F (36.5 C)  Resp: 18   Filed Weights   09/26/14 1211  Weight: 154 lb 9.6 oz (70.126 kg)    GENERAL:alert, no distress and comfortable SKIN: skin color, texture, turgor are normal, no rashes or significant lesions EYES: normal, Conjunctiva are pink and non-injected, sclera clear Musculoskeletal:no cyanosis of digits and no clubbing  NEURO: alert & oriented x 3 with fluent speech, no focal motor/sensory deficits  LABORATORY DATA:  I have reviewed the data as listed    Component Value Date/Time   NA 142 09/20/2014 0850   NA 143 05/06/2011 0545   K 4.0 09/20/2014 0850   K 3.5 05/06/2011  0545   CL 103 02/03/2013 1002   CL 105 05/06/2011 0545   CO2 29 09/20/2014 0850   CO2 31 05/06/2011 0545   GLUCOSE 107 09/20/2014 0850   GLUCOSE  114* 02/03/2013 1002   GLUCOSE 121* 05/06/2011 0545   BUN 11.3 09/20/2014 0850   BUN 16 05/06/2011 0545   CREATININE 0.8 09/20/2014 0850   CREATININE 0.83 05/06/2011 0545   CALCIUM 9.1 09/20/2014 0850   CALCIUM 9.0 05/06/2011 0545   PROT 6.7 09/20/2014 0850   ALBUMIN 4.1 09/20/2014 0850   AST 21 09/20/2014 0850   ALT 16 09/20/2014 0850   ALKPHOS 79 09/20/2014 0850   BILITOT 0.63 09/20/2014 0850   GFRNONAA >60 05/06/2011 0545   GFRAA >60 05/06/2011 0545    No results found for: SPEP, UPEP  Lab Results  Component Value Date   WBC 4.5 09/20/2014   NEUTROABS 2.3 09/20/2014   HGB 13.8 09/20/2014   HCT 39.8 09/20/2014   MCV 87.3 09/20/2014   PLT 153 09/20/2014      Chemistry      Component Value Date/Time   NA 142 09/20/2014 0850   NA 143 05/06/2011 0545   K 4.0 09/20/2014 0850   K 3.5 05/06/2011 0545   CL 103 02/03/2013 1002   CL 105 05/06/2011 0545   CO2 29 09/20/2014 0850   CO2 31 05/06/2011 0545   BUN 11.3 09/20/2014 0850   BUN 16 05/06/2011 0545   CREATININE 0.8 09/20/2014 0850   CREATININE 0.83 05/06/2011 0545      Component Value Date/Time   CALCIUM 9.1 09/20/2014 0850   CALCIUM 9.0 05/06/2011 0545   ALKPHOS 79 09/20/2014 0850   AST 21 09/20/2014 0850   ALT 16 09/20/2014 0850   BILITOT 0.63 09/20/2014 0850       RADIOGRAPHIC STUDIES: I reviewed the CT scan with her and her daughter I have personally reviewed the radiological images as listed and agreed with the findings in the report.   ASSESSMENT & PLAN:  High grade B-cell lymphoma I reviewed the scan with her and her daughter. Thankfully, it does not show any definitive signs of cancer recurrence. I will see her back in September repeat blood work, examination and imaging study.    Orders Placed This Encounter  Procedures  . CT Soft Tissue Neck W Contrast    Standing Status: Future     Number of Occurrences:      Standing Expiration Date: 12/27/2015    Order Specific Question:  Reason for  Exam (SYMPTOM  OR DIAGNOSIS REQUIRED)    Answer:  nasopharyngeal lymphoma, exclude recurrence    Order Specific Question:  Preferred imaging location?    Answer:  [facility]  . CBC with Differential/Platelet    Standing Status: Future     Number of Occurrences:      Standing Expiration Date: 12/27/2015  . Comprehensive metabolic panel    Standing Status: Future     Number of Occurrences:      Standing Expiration Date: 12/27/2015  . Lactate dehydrogenase    Standing Status: Future     Number of Occurrences:      Standing Expiration Date: 12/27/2015   All questions were answered. The patient knows to call the clinic with any problems, questions or concerns. No barriers to learning was detected. I spent 15 minutes counseling the patient face to face. The total time spent in the appointment was 20 minutes and more than 50% was on counseling and review of  test results     [facility], Deirdre Gryder, MD 09/26/2014 2:28 PM

## 2014-09-26 NOTE — Assessment & Plan Note (Signed)
I reviewed the scan with her and her daughter. Thankfully, it does not show any definitive signs of cancer recurrence. I will see her back in September repeat blood work, examination and imaging study.

## 2014-09-27 ENCOUNTER — Telehealth: Payer: Self-pay | Admitting: Hematology and Oncology

## 2014-09-27 NOTE — Telephone Encounter (Signed)
Left message to confirm appointment for Sept.

## 2014-11-30 IMAGING — PT NM PET TUM IMG INITIAL (PI) SKULL BASE T - THIGH
1 of 4 series · 1 of 25 positions shown · non-contrast
Comparison: None

CLINICAL DATA: Initial treatment strategy for non Hodgkin's
lymphoma.

NUCLEAR MEDICINE PET SKULL BASE TO THIGH
Fasting Blood Glucose:  111
TECHNIQUE: 17.7 mCi F-18 FDG was injected intravenously. CT data
was obtained and used for attenuation correction and anatomic
localization only.  (This was not acquired as a diagnostic CT
examination.) Additional exam technical data entered on
technologist worksheet.

[Series 2: ct images · axial · 3.8mm · 0.98mm/px · 1 of 267 slices shown]
[im 267/267  brain]
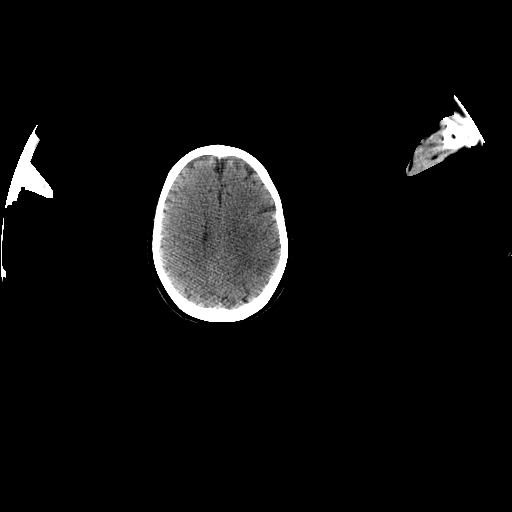

[1 of 25 positions shown; findings below may reference images not displayed]

FINDINGS: Neck: There is hypermetabolic mass in the posterior right
oronasopharynx which crosses the midline to involve the lymphoid
tissue on the left .  The activity extends inferiorly on the right
to involve the palatine tonsil.   The metabolic activity of this
mass is intense with SUV max = 31.6.

There are bilateral small level II lymph nodes.  One lymph node on
the left measuring 6 mm (image 37) does have elevated metabolic
activity.

There is symmetric activity within the salivary glands felt to be
physiologic.

There is asymmetric activity in the vocal cords with the left less
than the right.  There is no supraclavicular adenopathy.

Chest:  No hypermetabolic axillary or mediastinal lymph nodes.  A
5 mm nodule in the left upper lobe (image 83) which does not have
associated metabolic activity.

Abdomen/Pelvis:  No abnormal activity within the spleen which is
normal volume.  No hypermetabolic retroperitoneal periportal lymph
nodes.  Liver is normal.  No pelvic or inguinal adenopathy or
metabolic lymph nodes.

Skeleton:  No focal hypermetabolic activity to suggest skeletal
metastasis.
IMPRESSION: 1..  Intensely hypermetabolic mass within the posterior naso-
oropharynx extending to the right tonsillar pillar is consistent
with lymphoma.
2.  Small mildly hypermetabolic level II lymph nodes are
indeterminate.
3.  No evidence of supraclavicular, mediastinal, axillary or
retroperitoneal adenopathy.
4.  Normal spleen.
5.  No evidence skeletal metastasis.
6.  5 mm pulmonary nodule in the left upper lobe.  Recommend
attention on follow-up.
7..  Asymmetric activity  within the vocal cords without clear
explanation.

## 2015-01-04 IMAGING — RF DG FLUORO GUIDE SPINAL/SI JT INJ*L*
1 series · 1 of 1 positions shown · non-contrast
Comparison: none

CLINICAL DATA: High-grade non-Hodgkins lymphoma.

FLUOROSCOPICALLY GUIDED LUMBAR PUNCTURE FOR INTRATHECAL
CHEMOTHERAPY
Fluoroscopy time:  0 minutes 50 seconds
TECHNIQUE: Informed consent was obtained from the patient prior to
the procedure, including potential complications of headache,
allergy, and pain.   With the patient prone, the lower back was
prepped with Betadine.  1% Lidocaine was used for local anesthesia.
Lumbar puncture was performed at the L2-3 level using a 20 gauge
needle with return of clear CSF. 12 ml of CSF was obtained in 4
sterile collection vials and sent for laboratory evaluation as
ordered by the referring physician.  10 ml  of  methotrexate was
subsequently injected into the subarachnoid space. The spinal
needle was removed, and a sterile and shows applied to the skin
puncture site.  The patient was placed in supine position which
have been flat on stretcher, and sent to [REDACTED] for
observation.  The patient tolerated the procedure well without
apparent complication.

[Series 1: run · 1 of 1 slices shown]
[im 1/1]
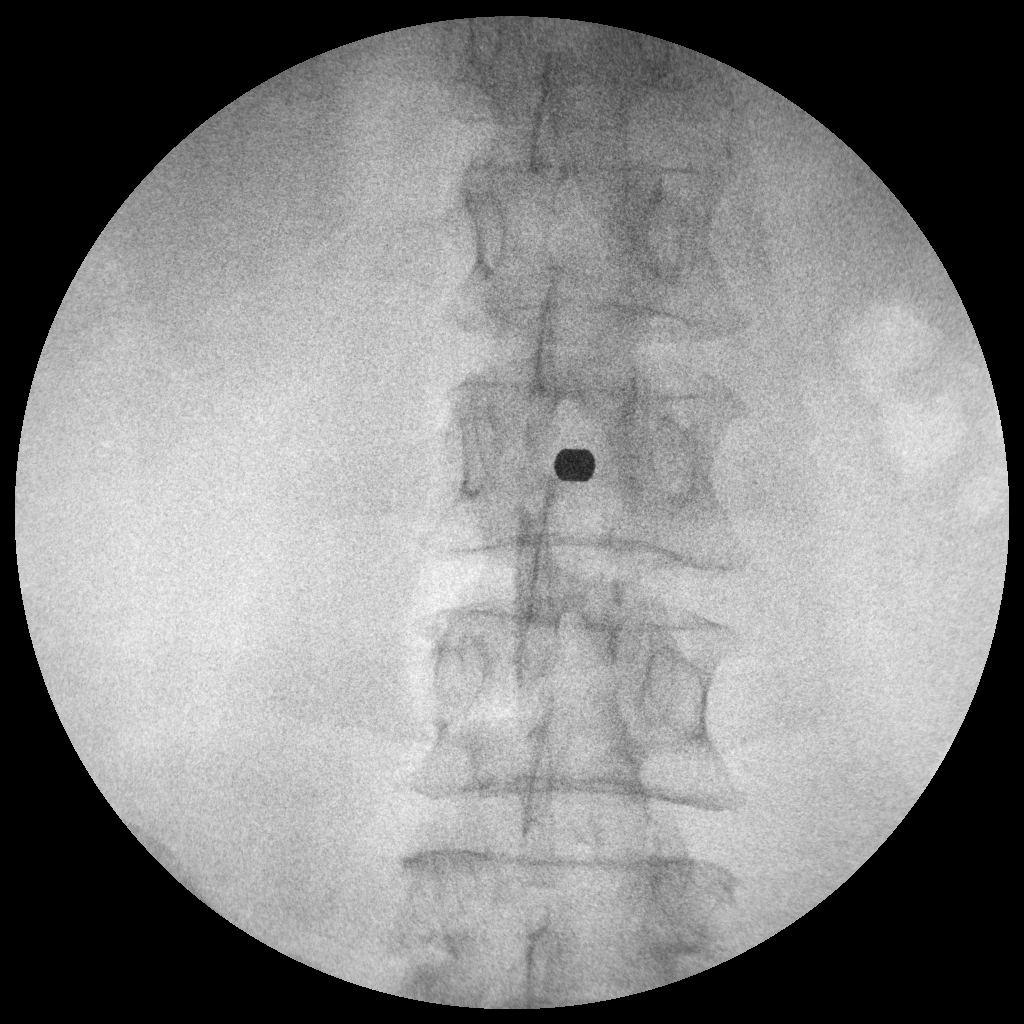

[1 of 1 positions shown; findings below may reference images not displayed]

IMPRESSION: Successful diagnostic and therapeutic lumbar puncture for
intrathecal chemotherapy, as described above.  No evidence of
immediate complication.

## 2015-05-05 ENCOUNTER — Telehealth: Payer: Self-pay | Admitting: Hematology and Oncology

## 2015-05-05 NOTE — Telephone Encounter (Signed)
returned call and lvm for pt confirming Sept appt.Marland KitchenMarland KitchenMarland Kitchen

## 2015-05-08 ENCOUNTER — Other Ambulatory Visit (HOSPITAL_BASED_OUTPATIENT_CLINIC_OR_DEPARTMENT_OTHER): Payer: Medicare HMO

## 2015-05-08 ENCOUNTER — Ambulatory Visit (HOSPITAL_COMMUNITY)
Admission: RE | Admit: 2015-05-08 | Discharge: 2015-05-08 | Disposition: A | Discharge status: Home / Self Care | Payer: Medicare HMO | Source: Ambulatory Visit | Attending: Hematology and Oncology | Admitting: Hematology and Oncology

## 2015-05-08 DIAGNOSIS — C8331 Diffuse large B-cell lymphoma, lymph nodes of head, face, and neck: Secondary | ICD-10-CM

## 2015-05-08 DIAGNOSIS — C8510 Unspecified B-cell lymphoma, unspecified site: Secondary | ICD-10-CM

## 2015-05-08 LAB — CBC WITH DIFFERENTIAL/PLATELET
BASO%: 1.3 % (ref 0.0–2.0)
BASOS ABS: 0 10*3/uL (ref 0.0–0.1)
EOS%: 3.1 % (ref 0.0–7.0)
Eosinophils Absolute: 0.1 10*3/uL (ref 0.0–0.5)
HCT: 40.3 % (ref 34.8–46.6)
HGB: 13.8 g/dL (ref 11.6–15.9)
LYMPH%: 38.7 % (ref 14.0–49.7)
MCH: 30.3 pg (ref 25.1–34.0)
MCHC: 34.2 g/dL (ref 31.5–36.0)
MCV: 88.6 fL (ref 79.5–101.0)
MONO#: 0.3 10*3/uL (ref 0.1–0.9)
MONO%: 8.4 % (ref 0.0–14.0)
NEUT#: 1.5 10*3/uL (ref 1.5–6.5)
NEUT%: 48.5 % (ref 38.4–76.8)
Platelets: 167 10*3/uL (ref 145–400)
RBC: 4.55 10*6/uL (ref 3.70–5.45)
RDW: 13.1 % (ref 11.2–14.5)
WBC: 3.2 10*3/uL — ABNORMAL LOW (ref 3.9–10.3)
lymph#: 1.2 10*3/uL (ref 0.9–3.3)

## 2015-05-08 LAB — COMPREHENSIVE METABOLIC PANEL (CC13)
ALT: 10 U/L (ref 0–55)
AST: 17 U/L (ref 5–34)
Albumin: 4.1 g/dL (ref 3.5–5.0)
Alkaline Phosphatase: 74 U/L (ref 40–150)
Anion Gap: 7 mEq/L (ref 3–11)
BUN: 12.1 mg/dL (ref 7.0–26.0)
CHLORIDE: 107 meq/L (ref 98–109)
CO2: 29 mEq/L (ref 22–29)
Calcium: 9.4 mg/dL (ref 8.4–10.4)
Creatinine: 0.9 mg/dL (ref 0.6–1.1)
EGFR: 63 mL/min/{1.73_m2} — ABNORMAL LOW (ref >90)
Glucose: 93 mg/dl (ref 70–140)
POTASSIUM: 4 meq/L (ref 3.5–5.1)
SODIUM: 143 meq/L (ref 136–145)
Total Bilirubin: 0.68 mg/dL (ref 0.20–1.20)
Total Protein: 6.8 g/dL (ref 6.4–8.3)

## 2015-05-08 LAB — LACTATE DEHYDROGENASE (CC13): LDH: 186 U/L (ref 125–245)

## 2015-05-08 MED ORDER — IOHEXOL 300 MG/ML  SOLN
75.0000 mL | Freq: Once | INTRAMUSCULAR | Status: AC | PRN
  Administered 2015-05-08: 75 mL via INTRAVENOUS

## 2015-05-15 ENCOUNTER — Telehealth: Payer: Self-pay | Admitting: Hematology and Oncology

## 2015-05-15 ENCOUNTER — Encounter: Payer: Self-pay | Admitting: Hematology and Oncology

## 2015-05-15 ENCOUNTER — Ambulatory Visit (HOSPITAL_BASED_OUTPATIENT_CLINIC_OR_DEPARTMENT_OTHER): Payer: Medicare HMO | Admitting: Hematology and Oncology

## 2015-05-15 VITALS — BP 168/81 | HR 69 | Temp 98.0°F | Resp 18 | Ht 68.0 in | Wt 163.2 lb

## 2015-05-15 DIAGNOSIS — I10 Essential (primary) hypertension: Secondary | ICD-10-CM

## 2015-05-15 DIAGNOSIS — D72819 Decreased white blood cell count, unspecified: Secondary | ICD-10-CM

## 2015-05-15 DIAGNOSIS — C8510 Unspecified B-cell lymphoma, unspecified site: Secondary | ICD-10-CM | POA: Diagnosis not present

## 2015-05-15 NOTE — Telephone Encounter (Signed)
per pof to sch pt appt-gave pt copy of avs °

## 2015-05-15 NOTE — Assessment & Plan Note (Addendum)
I recommend close follow-up with primary care doctor for BP monitoring and initiation of BP medications

## 2015-05-15 NOTE — Progress Notes (Signed)
[place] OFFICE PROGRESS NOTE  Patient Care Team: Heath Lark, MD as PCP - General (Hematology and Oncology) Celedonio Savage, MD as PCP - Family Medicine (Family Medicine) Flossie Buffy. Redmond Pulling, MD as Referring Physician (Otolaryngology) Heath Lark, MD as Consulting Physician (Hematology and Oncology)  SUMMARY OF ONCOLOGIC HISTORY:   High grade B-cell lymphoma   01/31/2013 Initial Diagnosis High grade B-cell lymphoma   09/23/2014 Imaging Repeat CT scan showed no evidence of disease    I reviewed the patient's records extensive and collaborated the history with the patient. Summary of her history is as follows: This patient was originally diagnosed last summer after presentation with sinus drainage and ear pain. 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 Staging bone marrow biopsy was negative. Cerebrospinal fluid from lumbar puncture was negative. The patient received 4 cycles of R-CHOP chemotherapy along with 3 cycles of prophylactic intrathecal methotrexate and had completed most of her treatment by 05/04/2013. She underwent involved field radiation therapy afterwards. Her last PET scan from November 2014 was negative for recurrence.  INTERVAL HISTORY: Please see below for problem oriented charting. She returns for follow-up Denies new lymphadenopathy. No recent infection  REVIEW OF SYSTEMS:   Constitutional: Denies fevers, chills or abnormal weight loss Eyes: Denies blurriness of vision Ears, nose, mouth, throat, and face: Denies mucositis or sore throat Respiratory: Denies cough, dyspnea or wheezes Cardiovascular: Denies palpitation, chest discomfort or lower extremity swelling Gastrointestinal:  Denies nausea, heartburn or change in bowel habits Skin: Denies abnormal skin rashes Lymphatics: Denies new lymphadenopathy or easy  bruising Neurological:Denies numbness, tingling or new weaknesses Behavioral/Psych: Mood is stable, no new changes  All other systems were reviewed with the patient and are negative.  I have reviewed the past medical history, past surgical history, social history and family history with the patient and they are unchanged from previous note.  ALLERGIES:  has No Known Allergies.  MEDICATIONS:  Current Outpatient Prescriptions  Medication Sig Dispense Refill  . Cholecalciferol (VITAMIN D) 2000 UNITS CAPS Take 2,000 Units by mouth daily.    . Multiple Vitamin (MULTIVITAMIN WITH MINERALS) TABS Take 1 tablet by mouth daily.     No current facility-administered medications for this visit.   Facility-Administered Medications Ordered in Other Visits  Medication Dose Route Frequency Provider Last Rate Last Dose  . sodium chloride 0.9 % injection 10 mL  10 mL Intracatheter PRN Annia Belt, MD   10 mL at 04/12/13 1318    PHYSICAL EXAMINATION: ECOG PERFORMANCE STATUS: 0 - Asymptomatic  Filed Vitals:   05/15/15 1111  BP: 168/81  Pulse: 69  Temp: 98 F (36.7 C)  Resp: 18   Filed Weights   05/15/15 1111  Weight: 163 lb 3.2 oz (74.027 kg)    GENERAL:alert, no distress and comfortable SKIN: skin color, texture, turgor are normal, no rashes or significant lesions EYES: normal, Conjunctiva are pink and non-injected, sclera clear OROPHARYNX:no exudate, no erythema and lips, buccal mucosa, and tongue normal  NECK: supple, thyroid normal size, non-tender, without nodularity LYMPH:  no palpable lymphadenopathy in the cervical, axillary or inguinal LUNGS: clear to auscultation and percussion with normal breathing effort HEART: regular rate & rhythm and no murmurs and no lower extremity edema ABDOMEN:abdomen soft, non-tender and normal bowel sounds Musculoskeletal:no cyanosis of digits and no clubbing  NEURO: alert & oriented x 3 with fluent speech, no focal motor/sensory  deficits  LABORATORY DATA:  I have reviewed the data as listed    Component Value Date/Time   NA 143 05/08/2015 0936   NA 143 05/06/2011 0545   K 4.0 05/08/2015 0936   K 3.5 05/06/2011 0545   CL 103 02/03/2013 1002   CL 105 05/06/2011 0545   CO2 29 05/08/2015 0936   CO2 31 05/06/2011 0545   GLUCOSE 93 05/08/2015 0936   GLUCOSE 114* 02/03/2013 1002   GLUCOSE 121* 05/06/2011 0545   BUN 12.1 05/08/2015 0936   BUN 16 05/06/2011 0545   CREATININE 0.9 05/08/2015 0936   CREATININE 0.83 05/06/2011 0545   CALCIUM 9.4 05/08/2015 0936   CALCIUM 9.0 05/06/2011 0545   PROT 6.8 05/08/2015 0936   ALBUMIN 4.1 05/08/2015 0936   AST 17 05/08/2015 0936   ALT 10 05/08/2015 0936   ALKPHOS 74 05/08/2015 0936   BILITOT 0.68 05/08/2015 0936   GFRNONAA >60 05/06/2011 0545   GFRAA >60 05/06/2011 0545    No results found for: SPEP, UPEP  Lab Results  Component Value Date   WBC 3.2* 05/08/2015   NEUTROABS 1.5 05/08/2015   HGB 13.8 05/08/2015   HCT 40.3 05/08/2015   MCV 88.6 05/08/2015   PLT 167 05/08/2015      Chemistry      Component Value Date/Time   NA 143 05/08/2015 0936   NA 143 05/06/2011 0545   K 4.0 05/08/2015 0936   K 3.5 05/06/2011 0545   CL 103 02/03/2013 1002   CL 105 05/06/2011 0545   CO2 29 05/08/2015 0936   CO2 31 05/06/2011 0545   BUN 12.1 05/08/2015 0936   BUN 16 05/06/2011 0545   CREATININE 0.9 05/08/2015 0936   CREATININE 0.83 05/06/2011 0545      Component Value Date/Time   CALCIUM 9.4 05/08/2015 0936   CALCIUM 9.0 05/06/2011 0545   ALKPHOS 74 05/08/2015 0936   AST 17 05/08/2015 0936   ALT 10 05/08/2015 0936   BILITOT 0.68 05/08/2015 0936       RADIOGRAPHIC STUDIES:I reviewed recent imaging which showed no evidence of disease I have personally reviewed the radiological images as listed and agreed with the findings in the report.    ASSESSMENT & PLAN:  High grade B-cell lymphoma I reviewed the scan with her. Thankfully, it does not show any  definitive signs of cancer recurrence. I will see her back in 9 months with repeat blood work, labs and examination. There is no benefit for further routine surveillance imaging   Essential hypertension I recommend close follow-up with primary care doctor for BP monitoring and initiation of BP medications  Leukopenia The cause sis unknown. The patient denies recent history of fevers, cough, chills, diarrhea or dysuria. She is asymptomatic from the leukopenia. I will observe for now.       Orders Placed This Encounter  Procedures  . CBC with Differential/Platelet    Standing Status: Future     Number of Occurrences:      Standing Expiration Date: 06/18/2016  . Comprehensive metabolic panel    Standing Status: Future     Number of Occurrences:      Standing Expiration Date: 06/18/2016  . Lactate dehydrogenase    Standing Status: Future     Number of Occurrences:      Standing Expiration Date: 06/18/2016   All questions were answered. The patient knows to call the clinic with any problems, questions or concerns. No barriers to learning was detected. I spent 15 minutes counseling  the patient face to face. The total time spent in the appointment was 20 minutes and more than 50% was on counseling and review of test results     [facility], [place], MD 05/15/2015 5:24 PM

## 2015-05-15 NOTE — Assessment & Plan Note (Signed)
I reviewed the scan with her. Thankfully, it does not show any definitive signs of cancer recurrence. I will see her back in 9 months with repeat blood work, labs and examination. There is no benefit for further routine surveillance imaging

## 2015-05-15 NOTE — Assessment & Plan Note (Signed)
The cause sis unknown. The patient denies recent history of fevers, cough, chills, diarrhea or dysuria. She is asymptomatic from the leukopenia. I will observe for now.

## 2016-02-12 ENCOUNTER — Ambulatory Visit (HOSPITAL_BASED_OUTPATIENT_CLINIC_OR_DEPARTMENT_OTHER): Payer: Medicare HMO | Admitting: Hematology and Oncology

## 2016-02-12 ENCOUNTER — Encounter: Payer: Self-pay | Admitting: Hematology and Oncology

## 2016-02-12 ENCOUNTER — Other Ambulatory Visit (HOSPITAL_BASED_OUTPATIENT_CLINIC_OR_DEPARTMENT_OTHER): Payer: Medicare HMO

## 2016-02-12 ENCOUNTER — Telehealth: Payer: Self-pay | Admitting: Hematology and Oncology

## 2016-02-12 VITALS — BP 170/83 | HR 70 | Temp 98.1°F | Resp 18 | Ht 68.0 in | Wt 172.3 lb

## 2016-02-12 DIAGNOSIS — C8510 Unspecified B-cell lymphoma, unspecified site: Secondary | ICD-10-CM | POA: Diagnosis not present

## 2016-02-12 DIAGNOSIS — I10 Essential (primary) hypertension: Secondary | ICD-10-CM

## 2016-02-12 LAB — COMPREHENSIVE METABOLIC PANEL
ALT: 14 U/L (ref 0–55)
AST: 17 U/L (ref 5–34)
Albumin: 3.8 g/dL (ref 3.5–5.0)
Alkaline Phosphatase: 69 U/L (ref 40–150)
Anion Gap: 7 mEq/L (ref 3–11)
BILIRUBIN TOTAL: 0.51 mg/dL (ref 0.20–1.20)
BUN: 14.2 mg/dL (ref 7.0–26.0)
CO2: 27 meq/L (ref 22–29)
Calcium: 9 mg/dL (ref 8.4–10.4)
Chloride: 106 mEq/L (ref 98–109)
Creatinine: 0.8 mg/dL (ref 0.6–1.1)
EGFR: 71 mL/min/{1.73_m2} — ABNORMAL LOW (ref >90)
Glucose: 103 mg/dl (ref 70–140)
Potassium: 3.9 mEq/L (ref 3.5–5.1)
Sodium: 140 mEq/L (ref 136–145)
TOTAL PROTEIN: 6.7 g/dL (ref 6.4–8.3)

## 2016-02-12 LAB — CBC WITH DIFFERENTIAL/PLATELET
BASO%: 0.5 % (ref 0.0–2.0)
Basophils Absolute: 0 10*3/uL (ref 0.0–0.1)
EOS ABS: 0.1 10*3/uL (ref 0.0–0.5)
EOS%: 3.3 % (ref 0.0–7.0)
HCT: 38.1 % (ref 34.8–46.6)
HGB: 13.4 g/dL (ref 11.6–15.9)
LYMPH#: 1.4 10*3/uL (ref 0.9–3.3)
LYMPH%: 33.3 % (ref 14.0–49.7)
MCH: 30.6 pg (ref 25.1–34.0)
MCHC: 35.2 g/dL (ref 31.5–36.0)
MCV: 87 fL (ref 79.5–101.0)
MONO#: 0.3 10*3/uL (ref 0.1–0.9)
MONO%: 6.9 % (ref 0.0–14.0)
NEUT%: 56 % (ref 38.4–76.8)
NEUTROS ABS: 2.4 10*3/uL (ref 1.5–6.5)
PLATELETS: 164 10*3/uL (ref 145–400)
RBC: 4.38 10*6/uL (ref 3.70–5.45)
RDW: 12.8 % (ref 11.2–14.5)
WBC: 4.2 10*3/uL (ref 3.9–10.3)

## 2016-02-12 LAB — LACTATE DEHYDROGENASE: LDH: 175 U/L (ref 125–245)

## 2016-02-12 NOTE — Assessment & Plan Note (Signed)
She has no signs or symptoms of disease recurrence.  I will see her back in one year with repeat blood work, labs and examination. There is no benefit for further routine surveillance imaging 

## 2016-02-12 NOTE — Progress Notes (Signed)
[place] OFFICE PROGRESS NOTE  Patient Care Team: Heath Lark, MD as PCP - General (Hematology and Oncology) Celedonio Savage, MD as PCP - Family Medicine (Family Medicine) Flossie Buffy. Redmond Pulling, MD as Referring Physician (Otolaryngology) Heath Lark, MD as Consulting Physician (Hematology and Oncology)  SUMMARY OF ONCOLOGIC HISTORY:   High grade B-cell lymphoma ([place])   01/31/2013 Initial Diagnosis High grade B-cell lymphoma   09/23/2014 Imaging Repeat CT scan showed no evidence of disease   I reviewed the patient's records extensive and collaborated the history with the patient. Summary of her history is as follows: This patient was originally diagnosed last summer after presentation with sinus drainage and ear pain. 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 Staging bone marrow biopsy was negative. Cerebrospinal fluid from lumbar puncture was negative. The patient received 4 cycles of R-CHOP chemotherapy along with 3 cycles of prophylactic intrathecal methotrexate and had completed most of her treatment by 05/04/2013. She underwent involved field radiation therapy afterwards. Her last PET scan from November 2014 was negative for recurrence.  INTERVAL HISTORY: Please see below for problem oriented charting. She returns for further follow-up. She feels well. Denies recent lymphadenopathy. No recent infection.  REVIEW OF SYSTEMS:   Constitutional: Denies fevers, chills or abnormal weight loss Eyes: Denies blurriness of vision Ears, nose, mouth, throat, and face: Denies mucositis or sore throat Respiratory: Denies cough, dyspnea or wheezes Cardiovascular: Denies palpitation, chest discomfort or lower extremity swelling Gastrointestinal:  Denies nausea, heartburn or change in bowel habits Skin: Denies abnormal skin rashes Lymphatics: Denies new  lymphadenopathy or easy bruising Neurological:Denies numbness, tingling or new weaknesses Behavioral/Psych: Mood is stable, no new changes  All other systems were reviewed with the patient and are negative.  I have reviewed the past medical history, past surgical history, social history and family history with the patient and they are unchanged from previous note.  ALLERGIES:  has No Known Allergies.  MEDICATIONS:  No current outpatient prescriptions on file.   No current facility-administered medications for this visit.   Facility-Administered Medications Ordered in Other Visits  Medication Dose Route Frequency Provider Last Rate Last Dose  . sodium chloride 0.9 % injection 10 mL  10 mL Intracatheter PRN Annia Belt, MD   10 mL at 04/12/13 1318    PHYSICAL EXAMINATION: ECOG PERFORMANCE STATUS: 0 - Asymptomatic  Filed Vitals:   02/12/16 1053  BP: 170/83  Pulse: 70  Temp: 98.1 F (36.7 C)  Resp: 18   Filed Weights   02/12/16 1053  Weight: 172 lb 4.8 oz (78.155 kg)    GENERAL:alert, no distress and comfortable SKIN: skin color, texture, turgor are normal, no rashes or significant lesions EYES: normal, Conjunctiva are pink and non-injected, sclera clear OROPHARYNX:no exudate, no erythema and lips, buccal mucosa, and tongue normal  NECK: supple, thyroid normal size, non-tender, without nodularity LYMPH:  no palpable lymphadenopathy in the cervical, axillary or inguinal LUNGS: clear to auscultation and percussion with normal breathing effort HEART: regular rate & rhythm and no murmurs and no lower extremity edema ABDOMEN:abdomen soft, non-tender and normal bowel sounds Musculoskeletal:no cyanosis of digits and no clubbing  NEURO: alert & oriented x 3 with fluent speech, no focal motor/sensory deficits  LABORATORY DATA:  I have reviewed the data as listed    Component Value Date/Time   NA 140 02/12/2016 1043   NA 143 05/06/2011 0545  K 3.9 02/12/2016 1043   K  3.5 05/06/2011 0545   CL 103 02/03/2013 1002   CL 105 05/06/2011 0545   CO2 27 02/12/2016 1043   CO2 31 05/06/2011 0545   GLUCOSE 103 02/12/2016 1043   GLUCOSE 114* 02/03/2013 1002   GLUCOSE 121* 05/06/2011 0545   BUN 14.2 02/12/2016 1043   BUN 16 05/06/2011 0545   CREATININE 0.8 02/12/2016 1043   CREATININE 0.83 05/06/2011 0545   CALCIUM 9.0 02/12/2016 1043   CALCIUM 9.0 05/06/2011 0545   PROT 6.7 02/12/2016 1043   ALBUMIN 3.8 02/12/2016 1043   AST 17 02/12/2016 1043   ALT 14 02/12/2016 1043   ALKPHOS 69 02/12/2016 1043   BILITOT 0.51 02/12/2016 1043   GFRNONAA >60 05/06/2011 0545   GFRAA >60 05/06/2011 0545    No results found for: SPEP, UPEP  Lab Results  Component Value Date   WBC 4.2 02/12/2016   NEUTROABS 2.4 02/12/2016   HGB 13.4 02/12/2016   HCT 38.1 02/12/2016   MCV 87.0 02/12/2016   PLT 164 02/12/2016      Chemistry      Component Value Date/Time   NA 140 02/12/2016 1043   NA 143 05/06/2011 0545   K 3.9 02/12/2016 1043   K 3.5 05/06/2011 0545   CL 103 02/03/2013 1002   CL 105 05/06/2011 0545   CO2 27 02/12/2016 1043   CO2 31 05/06/2011 0545   BUN 14.2 02/12/2016 1043   BUN 16 05/06/2011 0545   CREATININE 0.8 02/12/2016 1043   CREATININE 0.83 05/06/2011 0545      Component Value Date/Time   CALCIUM 9.0 02/12/2016 1043   CALCIUM 9.0 05/06/2011 0545   ALKPHOS 69 02/12/2016 1043   AST 17 02/12/2016 1043   ALT 14 02/12/2016 1043   BILITOT 0.51 02/12/2016 1043       ASSESSMENT & PLAN:  High grade B-cell lymphoma She has no signs or symptoms of disease recurrence.  I will see her back in one year with repeat blood work, labs and examination. There is no benefit for further routine surveillance imaging     Essential hypertension She has intermittent hypertension on exam but according to the patient, she felt it is most compatible with whitecoat hypertension. I recommend observation only and follow-up with primary care doctor   Orders  Placed This Encounter  Procedures  . CBC with Differential/Platelet    Standing Status: Future     Number of Occurrences:      Standing Expiration Date: 03/18/2017  . Comprehensive metabolic panel    Standing Status: Future     Number of Occurrences:      Standing Expiration Date: 03/18/2017  . Lactate dehydrogenase (LDH)    Standing Status: Future     Number of Occurrences:      Standing Expiration Date: 03/18/2017   All questions were answered. The patient knows to call the clinic with any problems, questions or concerns. No barriers to learning was detected. I spent 15 minutes counseling the patient face to face. The total time spent in the appointment was 20 minutes and more than 50% was on counseling and review of test results     [facility], [place], MD 02/12/2016 2:03 PM

## 2016-02-12 NOTE — Assessment & Plan Note (Signed)
She has intermittent hypertension on exam but according to the patient, she felt it is most compatible with whitecoat hypertension. I recommend observation only and follow-up with primary care doctor

## 2016-02-12 NOTE — Telephone Encounter (Signed)
Gave and printed appt sched and avs for pt for June 2018 °

## 2017-02-11 ENCOUNTER — Ambulatory Visit: Payer: Medicare HMO | Admitting: Hematology and Oncology

## 2017-02-11 ENCOUNTER — Other Ambulatory Visit: Payer: Medicare HMO

## 2017-03-04 ENCOUNTER — Encounter: Payer: Self-pay | Admitting: Hematology and Oncology

## 2017-03-04 ENCOUNTER — Other Ambulatory Visit (HOSPITAL_BASED_OUTPATIENT_CLINIC_OR_DEPARTMENT_OTHER): Payer: Medicare HMO

## 2017-03-04 ENCOUNTER — Ambulatory Visit (HOSPITAL_BASED_OUTPATIENT_CLINIC_OR_DEPARTMENT_OTHER): Payer: Medicare HMO | Admitting: Hematology and Oncology

## 2017-03-04 VITALS — BP 173/83 | HR 72 | Temp 97.8°F | Resp 18 | Ht 68.0 in | Wt 173.1 lb

## 2017-03-04 DIAGNOSIS — I10 Essential (primary) hypertension: Secondary | ICD-10-CM

## 2017-03-04 DIAGNOSIS — C8510 Unspecified B-cell lymphoma, unspecified site: Secondary | ICD-10-CM

## 2017-03-04 LAB — COMPREHENSIVE METABOLIC PANEL
ALT: 8 U/L (ref 0–55)
AST: 15 U/L (ref 5–34)
Albumin: 4 g/dL (ref 3.5–5.0)
Alkaline Phosphatase: 76 U/L (ref 40–150)
Anion Gap: 7 mEq/L (ref 3–11)
BUN: 13.7 mg/dL (ref 7.0–26.0)
CHLORIDE: 106 meq/L (ref 98–109)
CO2: 28 meq/L (ref 22–29)
Calcium: 9.6 mg/dL (ref 8.4–10.4)
Creatinine: 0.8 mg/dL (ref 0.6–1.1)
EGFR: 72 mL/min/{1.73_m2} — ABNORMAL LOW (ref >90)
GLUCOSE: 96 mg/dL (ref 70–140)
Potassium: 3.9 mEq/L (ref 3.5–5.1)
SODIUM: 141 meq/L (ref 136–145)
Total Bilirubin: 0.49 mg/dL (ref 0.20–1.20)
Total Protein: 6.9 g/dL (ref 6.4–8.3)

## 2017-03-04 LAB — CBC WITH DIFFERENTIAL/PLATELET
BASO%: 0.9 % (ref 0.0–2.0)
Basophils Absolute: 0 10*3/uL (ref 0.0–0.1)
EOS%: 3 % (ref 0.0–7.0)
Eosinophils Absolute: 0.1 10*3/uL (ref 0.0–0.5)
HCT: 39.9 % (ref 34.8–46.6)
HGB: 13.7 g/dL (ref 11.6–15.9)
LYMPH%: 32.5 % (ref 14.0–49.7)
MCH: 30.1 pg (ref 25.1–34.0)
MCHC: 34.4 g/dL (ref 31.5–36.0)
MCV: 87.6 fL (ref 79.5–101.0)
MONO#: 0.3 10*3/uL (ref 0.1–0.9)
MONO%: 6.8 % (ref 0.0–14.0)
NEUT#: 2.2 10*3/uL (ref 1.5–6.5)
NEUT%: 56.8 % (ref 38.4–76.8)
Platelets: 190 10*3/uL (ref 145–400)
RBC: 4.55 10*6/uL (ref 3.70–5.45)
RDW: 12.7 % (ref 11.2–14.5)
WBC: 3.9 10*3/uL (ref 3.9–10.3)
lymph#: 1.3 10*3/uL (ref 0.9–3.3)

## 2017-03-04 LAB — LACTATE DEHYDROGENASE: LDH: 187 U/L (ref 125–245)

## 2017-03-04 MED ORDER — AMLODIPINE BESYLATE 5 MG PO TABS: 5.0000 mg | ORAL_TABLET | Freq: Every day | ORAL | 1 refills | Status: DC

## 2017-03-04 MED ORDER — ATENOLOL 25 MG PO TABS: 25.0000 mg | ORAL_TABLET | Freq: Every day | ORAL | 1 refills | Status: DC

## 2017-03-05 NOTE — Assessment & Plan Note (Signed)
She has no signs or symptoms of disease recurrence.  I will see her back in one year with repeat blood work, labs and examination. There is no benefit for further routine surveillance imaging

## 2017-03-05 NOTE — Progress Notes (Signed)
[place] OFFICE PROGRESS NOTE  Patient Care Team: Heath Lark, MD as PCP - General (Hematology and Oncology) Celedonio Savage, MD as PCP - Family Medicine (Family Medicine) Dorthula Rue., MD as Referring Physician (Otolaryngology) Heath Lark, MD as Consulting Physician (Hematology and Oncology)  SUMMARY OF ONCOLOGIC HISTORY:   High grade B-cell lymphoma ([place])   01/31/2013 Initial Diagnosis    High grade B-cell lymphoma      09/23/2014 Imaging    Repeat CT scan showed no evidence of disease      This patient was originally diagnosed last summer after presentation with sinus drainage and ear pain. 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 Staging bone marrow biopsy was negative. Cerebrospinal fluid from lumbar puncture was negative. The patient received 4 cycles of R-CHOP chemotherapy along with 3 cycles of prophylactic intrathecal methotrexate and had completed most of her treatment by 05/04/2013. She underwent involved field radiation therapy afterwards. Her last PET scan from November 2014 was negative for recurrence.  INTERVAL HISTORY: Please see below for problem oriented charting. She feels well Denies recent infection or new lymphadenopathy She has lost her PCP recently All her preventive screening is uptodate  REVIEW OF SYSTEMS:   Constitutional: Denies fevers, chills or abnormal weight loss Eyes: Denies blurriness of vision Ears, nose, mouth, throat, and face: Denies mucositis or sore throat Respiratory: Denies cough, dyspnea or wheezes Cardiovascular: Denies palpitation, chest discomfort or lower extremity swelling Gastrointestinal:  Denies nausea, heartburn or change in bowel habits Skin: Denies abnormal skin rashes Lymphatics: Denies new lymphadenopathy or easy bruising Neurological:Denies numbness, tingling or new  weaknesses Behavioral/Psych: Mood is stable, no new changes  All other systems were reviewed with the patient and are negative.  I have reviewed the past medical history, past surgical history, social history and family history with the patient and they are unchanged from previous note.  ALLERGIES:  has No Known Allergies.  MEDICATIONS:  Current Outpatient Prescriptions  Medication Sig Dispense Refill  . ALPRAZolam (XANAX) 0.5 MG tablet     . amLODipine (NORVASC) 2.5 MG tablet     . cholecalciferol (VITAMIN D) 1000 units tablet Take 1,000 Units by mouth daily.    . Garlic 350 MG TABS Take by mouth.    . Omega-3 Fatty Acids (FISH OIL) 1000 MG CAPS Take by mouth.    . venlafaxine XR (EFFEXOR-XR) 37.5 MG 24 hr capsule     . vitamin E 100 UNIT capsule Take by mouth daily.     No current facility-administered medications for this visit.    Facility-Administered Medications Ordered in Other Visits  Medication Dose Route Frequency Provider Last Rate Last Dose  . sodium chloride 0.9 % injection 10 mL  10 mL Intracatheter PRN Annia Belt, MD   10 mL at 04/12/13 1318    PHYSICAL EXAMINATION: ECOG PERFORMANCE STATUS: 0 - Asymptomatic  Vitals:   03/04/17 1251  BP: (!) 173/83  Pulse: 72  Resp: 18  Temp: 97.8 F (36.6 C)   Filed Weights   03/04/17 1251  Weight: 173 lb 1.6 oz (78.5 kg)    GENERAL:alert, no distress and comfortable SKIN: skin color, texture, turgor are normal, no rashes or significant lesions EYES: normal, Conjunctiva are pink and non-injected, sclera clear OROPHARYNX:no exudate, no erythema and lips, buccal mucosa, and tongue normal  NECK: supple, thyroid normal size, non-tender, without nodularity LYMPH:  no  palpable lymphadenopathy in the cervical, axillary or inguinal LUNGS: clear to auscultation and percussion with normal breathing effort HEART: regular rate & rhythm and no murmurs and no lower extremity edema ABDOMEN:abdomen soft, non-tender and  normal bowel sounds Musculoskeletal:no cyanosis of digits and no clubbing  NEURO: alert & oriented x 3 with fluent speech, no focal motor/sensory deficits  LABORATORY DATA:  I have reviewed the data as listed    Component Value Date/Time   NA 141 03/04/2017 1231   K 3.9 03/04/2017 1231   CL 103 02/03/2013 1002   CO2 28 03/04/2017 1231   GLUCOSE 96 03/04/2017 1231   GLUCOSE 114 (H) 02/03/2013 1002   BUN 13.7 03/04/2017 1231   CREATININE 0.8 03/04/2017 1231   CALCIUM 9.6 03/04/2017 1231   PROT 6.9 03/04/2017 1231   ALBUMIN 4.0 03/04/2017 1231   AST 15 03/04/2017 1231   ALT 8 03/04/2017 1231   ALKPHOS 76 03/04/2017 1231   BILITOT 0.49 03/04/2017 1231   GFRNONAA >60 05/06/2011 0545   GFRAA >60 05/06/2011 0545    No results found for: SPEP, UPEP  Lab Results  Component Value Date   WBC 3.9 03/04/2017   NEUTROABS 2.2 03/04/2017   HGB 13.7 03/04/2017   HCT 39.9 03/04/2017   MCV 87.6 03/04/2017   PLT 190 03/04/2017      Chemistry      Component Value Date/Time   NA 141 03/04/2017 1231   K 3.9 03/04/2017 1231   CL 103 02/03/2013 1002   CO2 28 03/04/2017 1231   BUN 13.7 03/04/2017 1231   CREATININE 0.8 03/04/2017 1231      Component Value Date/Time   CALCIUM 9.6 03/04/2017 1231   ALKPHOS 76 03/04/2017 1231   AST 15 03/04/2017 1231   ALT 8 03/04/2017 1231   BILITOT 0.49 03/04/2017 1231       ASSESSMENT & PLAN:  High grade B-cell lymphoma She has no signs or symptoms of disease recurrence.  I will see her back in one year with repeat blood work, labs and examination. There is no benefit for further routine surveillance imaging  Essential hypertension Her BP is poorly controlled She has recently lost her PCP She has cardiovascular risk factors I recommend aspirin therapy, increasing amlodipine to 5 mg and add atenolol She is advised to establish with new PCP for follow-up on BP control   No orders of the defined types were placed in this encounter.  All  questions were answered. The patient knows to call the clinic with any problems, questions or concerns. No barriers to learning was detected. I spent 10 minutes counseling the patient face to face. The total time spent in the appointment was 15 minutes and more than 50% was on counseling and review of test results     Heath Lark, MD 03/05/2017 7:21 AM

## 2017-03-05 NOTE — Assessment & Plan Note (Signed)
Her BP is poorly controlled She has recently lost her PCP She has cardiovascular risk factors I recommend aspirin therapy, increasing amlodipine to 5 mg and add atenolol She is advised to establish with new PCP for follow-up on BP control

## 2018-01-27 DIAGNOSIS — C8590 Non-Hodgkin lymphoma, unspecified, unspecified site: Secondary | ICD-10-CM | POA: Diagnosis not present

## 2018-01-27 DIAGNOSIS — I10 Essential (primary) hypertension: Secondary | ICD-10-CM | POA: Diagnosis not present

## 2018-01-27 DIAGNOSIS — R42 Dizziness and giddiness: Secondary | ICD-10-CM | POA: Diagnosis not present

## 2018-01-27 DIAGNOSIS — F329 Major depressive disorder, single episode, unspecified: Secondary | ICD-10-CM | POA: Diagnosis not present

## 2018-01-27 DIAGNOSIS — R05 Cough: Secondary | ICD-10-CM | POA: Diagnosis not present

## 2018-01-27 DIAGNOSIS — Z299 Encounter for prophylactic measures, unspecified: Secondary | ICD-10-CM | POA: Diagnosis not present

## 2018-01-27 DIAGNOSIS — F419 Anxiety disorder, unspecified: Secondary | ICD-10-CM | POA: Diagnosis not present

## 2018-01-27 DIAGNOSIS — Z6828 Body mass index (BMI) 28.0-28.9, adult: Secondary | ICD-10-CM | POA: Diagnosis not present

## 2018-01-27 DIAGNOSIS — Z87891 Personal history of nicotine dependence: Secondary | ICD-10-CM | POA: Diagnosis not present

## 2018-02-06 DIAGNOSIS — Z1211 Encounter for screening for malignant neoplasm of colon: Secondary | ICD-10-CM | POA: Diagnosis not present

## 2018-02-06 DIAGNOSIS — C8590 Non-Hodgkin lymphoma, unspecified, unspecified site: Secondary | ICD-10-CM | POA: Diagnosis not present

## 2018-02-06 DIAGNOSIS — Z0000 Encounter for general adult medical examination without abnormal findings: Secondary | ICD-10-CM | POA: Diagnosis not present

## 2018-02-06 DIAGNOSIS — Z6828 Body mass index (BMI) 28.0-28.9, adult: Secondary | ICD-10-CM | POA: Diagnosis not present

## 2018-02-06 DIAGNOSIS — R5383 Other fatigue: Secondary | ICD-10-CM | POA: Diagnosis not present

## 2018-02-06 DIAGNOSIS — Z1339 Encounter for screening examination for other mental health and behavioral disorders: Secondary | ICD-10-CM | POA: Diagnosis not present

## 2018-02-06 DIAGNOSIS — Z7189 Other specified counseling: Secondary | ICD-10-CM | POA: Diagnosis not present

## 2018-02-06 DIAGNOSIS — E2839 Other primary ovarian failure: Secondary | ICD-10-CM | POA: Diagnosis not present

## 2018-02-06 DIAGNOSIS — Z1331 Encounter for screening for depression: Secondary | ICD-10-CM | POA: Diagnosis not present

## 2018-02-19 DIAGNOSIS — E2839 Other primary ovarian failure: Secondary | ICD-10-CM | POA: Diagnosis not present

## 2018-03-03 ENCOUNTER — Other Ambulatory Visit: Payer: Self-pay | Admitting: Hematology and Oncology

## 2018-03-03 DIAGNOSIS — C8510 Unspecified B-cell lymphoma, unspecified site: Secondary | ICD-10-CM

## 2018-03-05 ENCOUNTER — Encounter: Payer: Self-pay | Admitting: Hematology and Oncology

## 2018-03-05 ENCOUNTER — Inpatient Hospital Stay: Payer: Medicare HMO | Attending: Hematology and Oncology | Admitting: Hematology and Oncology

## 2018-03-05 ENCOUNTER — Inpatient Hospital Stay: Payer: Medicare HMO

## 2018-03-05 DIAGNOSIS — I10 Essential (primary) hypertension: Secondary | ICD-10-CM | POA: Diagnosis not present

## 2018-03-05 DIAGNOSIS — C8510 Unspecified B-cell lymphoma, unspecified site: Secondary | ICD-10-CM | POA: Diagnosis not present

## 2018-03-05 LAB — COMPREHENSIVE METABOLIC PANEL
ALT: 13 U/L (ref 0–44)
AST: 17 U/L (ref 15–41)
Albumin: 4.4 g/dL (ref 3.5–5.0)
Alkaline Phosphatase: 77 U/L (ref 38–126)
Anion gap: 7 (ref 5–15)
BUN: 13 mg/dL (ref 8–23)
CO2: 28 mmol/L (ref 22–32)
Calcium: 9.4 mg/dL (ref 8.9–10.3)
Chloride: 105 mmol/L (ref 98–111)
Creatinine, Ser: 0.91 mg/dL (ref 0.44–1.00)
GFR calc Af Amer: >60 mL/min (ref >60)
GFR calc non Af Amer: >60 mL/min (ref >60)
Glucose, Bld: 93 mg/dL (ref 70–99)
Potassium: 4 mmol/L (ref 3.5–5.1)
SODIUM: 140 mmol/L (ref 135–145)
Total Bilirubin: 0.4 mg/dL (ref 0.3–1.2)
Total Protein: 7 g/dL (ref 6.5–8.1)

## 2018-03-05 LAB — LACTATE DEHYDROGENASE: LDH: 198 U/L — AB (ref 98–192)

## 2018-03-05 LAB — CBC WITH DIFFERENTIAL/PLATELET
BASOS ABS: 0 10*3/uL (ref 0.0–0.1)
BASOS PCT: 0 %
EOS ABS: 0.1 10*3/uL (ref 0.0–0.5)
Eosinophils Relative: 3 %
HCT: 38.7 % (ref 34.8–46.6)
Hemoglobin: 13.5 g/dL (ref 11.6–15.9)
LYMPHS ABS: 1.6 10*3/uL (ref 0.9–3.3)
Lymphocytes Relative: 33 %
MCH: 30.4 pg (ref 25.1–34.0)
MCHC: 34.9 g/dL (ref 31.5–36.0)
MCV: 87.2 fL (ref 79.5–101.0)
Monocytes Absolute: 0.3 10*3/uL (ref 0.1–0.9)
Monocytes Relative: 7 %
NEUTROS PCT: 57 %
Neutro Abs: 2.6 10*3/uL (ref 1.5–6.5)
PLATELETS: 195 10*3/uL (ref 145–400)
RBC: 4.44 MIL/uL (ref 3.70–5.45)
RDW: 12.9 % (ref 11.2–14.5)
WBC: 4.6 10*3/uL (ref 3.9–10.3)

## 2018-03-05 NOTE — Assessment & Plan Note (Signed)
She has no signs or symptoms of disease recurrence.  There is no benefit for further routine surveillance imaging She is almost 5 years out from completion of chemotherapy I recommend discontinuation of outpatient follow-up and for her to follow-up with primary care doctor only We discussed the signs and symptoms to watch out for cancer recurrence I discussed the importance of annual influenza vaccination

## 2018-03-05 NOTE — Progress Notes (Signed)
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  There is no benefit for further routine surveillance imaging She is almost 5 years out from completion of chemotherapy I recommend discontinuation of outpatient follow-up and for her to follow-up with primary care doctor only We discussed the signs and symptoms to watch out for cancer recurrence I discussed the importance of annual influenza vaccination  Essential hypertension She has chronic hypertension Her blood pressure measurement at home was within normal limits I suspect that is an element of whitecoat hypertension She will continue blood pressure management through her primary care doctor   No orders of the defined types were placed in this encounter.   INTERVAL HISTORY: Please see below for problem oriented charting. 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 Staging bone marrow biopsy on 02/05/2013 was slightly hypercellular with trilineage hematopoiesis and no evidence for involvement with lymphoma. A lumbar puncture was done on on 02/05/2013. The cerebrospinal fluid showed no malignant cells.   She completed cycle #1  CHOP/Rituxan beginning 02/08/2013 (she began the prednisone portion of the treatment on 02/03/2013 for symptomatic relief). She completed cycle 2 CHOP/Rituxan on 03/01/2013. She completed the first prophylactic intrathecal methotrexate treatment on 03/02/2013. She had significant nausea/vomiting which eventually responded to Emend. She completed cycle 3 CHOP/Rituxan on 03/22/2013 and cycle 2 prophylactic intrathecal methotrexate on 03/23/2013. Restaging CT evaluation 04/05/2013 showed resolution of the posterior nasal pharyngeal mass. PET scan 04/09/2013 showed response to therapy of nasopharyngeal primary/lymphoma. There was mild residual hypermetabolism without CT correlate favored to be treatment-related. She completed cycle 4 CHOP/Rituxan on 04/12/2013 and cycle 3 prophylactic intrathecal methotrexate on 04/13/2013. She completed the fourth and final prophylactic intrathecal methotrexate on 05/04/2013. She was referred to radiation oncology and completed involved field radiation beginning 05/17/2013. Restaging PET scan 07/06/2013 showed some mild asymmetric metabolic activity within the right oropharynx, likely physiologic as there is no CT correlate.       09/23/2014 Imaging    Repeat CT scan showed no evidence of disease       REVIEW OF SYSTEMS:   Constitutional: Denies fevers, chills or abnormal weight loss Eyes: Denies blurriness of vision Ears, nose, mouth, throat, and face: Denies mucositis or sore throat Respiratory: Denies cough, dyspnea or wheezes Cardiovascular: Denies palpitation, chest discomfort or lower extremity swelling Gastrointestinal:  Denies nausea, heartburn or change in bowel habits Skin: Denies abnormal skin rashes Lymphatics: Denies new lymphadenopathy or easy bruising Neurological:Denies numbness, tingling or new weaknesses Behavioral/Psych: Mood is stable, no new changes  All other systems were reviewed with the patient and are negative.  I have reviewed the past  medical history, past surgical history, social history and family history with the patient and they are unchanged from previous note.  ALLERGIES:  has No  Known Allergies.  MEDICATIONS:  Current Outpatient Medications  Medication Sig Dispense Refill  . atenolol (TENORMIN) 25 MG tablet Take 25 mg by mouth daily.    . Multiple Vitamins-Minerals (HAIR SKIN AND NAILS FORMULA PO) Take 2 capsules by mouth daily. 2500 mcg    . ALPRAZolam (XANAX) 0.5 MG tablet     . amLODipine (NORVASC) 2.5 MG tablet Take 5 mg by mouth daily.    . cholecalciferol (VITAMIN D) 1000 units tablet Take 1,000 Units by mouth daily.    . venlafaxine XR (EFFEXOR-XR) 37.5 MG 24 hr capsule      No current facility-administered medications for this visit.    Facility-Administered Medications Ordered in Other Visits  Medication Dose Route Frequency Provider Last Rate Last Dose  . sodium chloride 0.9 % injection 10 mL  10 mL Intracatheter PRN Granfortuna, James M, MD   10 mL at 04/12/13 1318    PHYSICAL EXAMINATION: ECOG PERFORMANCE STATUS: 0 - Asymptomatic  Vitals:   03/05/18 1442  BP: (!) 166/75  Pulse: 62  Resp: 18  Temp: 97.8 F (36.6 C)  SpO2: 100%   Filed Weights   03/05/18 1442  Weight: 183 lb (83 kg)    GENERAL:alert, no distress and comfortable SKIN: skin color, texture, turgor are normal, no rashes or significant lesions EYES: normal, Conjunctiva are pink and non-injected, sclera clear OROPHARYNX:no exudate, no erythema and lips, buccal mucosa, and tongue normal  NECK: supple, thyroid normal size, non-tender, without nodularity LYMPH:  no palpable lymphadenopathy in the cervical, axillary or inguinal LUNGS: clear to auscultation and percussion with normal breathing effort HEART: regular rate & rhythm and no murmurs and no lower extremity edema ABDOMEN:abdomen soft, non-tender and normal bowel sounds Musculoskeletal:no cyanosis of digits and no clubbing  NEURO: alert & oriented x 3 with fluent  speech, no focal motor/sensory deficits  LABORATORY DATA:  I have reviewed the data as listed    Component Value Date/Time   NA 141 03/04/2017 1231   K 3.9 03/04/2017 1231   CL 103 02/03/2013 1002   CO2 28 03/04/2017 1231   GLUCOSE 96 03/04/2017 1231   GLUCOSE 114 (H) 02/03/2013 1002   BUN 13.7 03/04/2017 1231   CREATININE 0.8 03/04/2017 1231   CALCIUM 9.6 03/04/2017 1231   PROT 6.9 03/04/2017 1231   ALBUMIN 4.0 03/04/2017 1231   AST 15 03/04/2017 1231   ALT 8 03/04/2017 1231   ALKPHOS 76 03/04/2017 1231   BILITOT 0.49 03/04/2017 1231   GFRNONAA >60 05/06/2011 0545   GFRAA >60 05/06/2011 0545    No results found for: SPEP, UPEP  Lab Results  Component Value Date   WBC 4.6 03/05/2018   NEUTROABS 2.6 03/05/2018   HGB 13.5 03/05/2018   HCT 38.7 03/05/2018   MCV 87.2 03/05/2018   PLT 195 03/05/2018      Chemistry      Component Value Date/Time   NA 141 03/04/2017 1231   K 3.9 03/04/2017 1231   CL 103 02/03/2013 1002   CO2 28 03/04/2017 1231   BUN 13.7 03/04/2017 1231   CREATININE 0.8 03/04/2017 1231      Component Value Date/Time   CALCIUM 9.6 03/04/2017 1231   ALKPHOS 76 03/04/2017 1231   AST 15 03/04/2017 1231   ALT 8 03/04/2017 1231   BILITOT 0.49 03/04/2017 1231      All questions were answered. The patient knows to call the clinic with any problems, questions or concerns. No barriers to learning was detected.    I spent 10 minutes counseling the patient face to face. The total time spent in the appointment was 15 minutes and more than 50% was on counseling and review of test results   , MD 03/05/2018 2:53 PM  

## 2018-03-05 NOTE — Assessment & Plan Note (Signed)
She has chronic hypertension Her blood pressure measurement at home was within normal limits I suspect that is an element of whitecoat hypertension She will continue blood pressure management through her primary care doctor

## 2018-03-10 DIAGNOSIS — Z1231 Encounter for screening mammogram for malignant neoplasm of breast: Secondary | ICD-10-CM | POA: Diagnosis not present

## 2018-03-25 DIAGNOSIS — I10 Essential (primary) hypertension: Secondary | ICD-10-CM | POA: Diagnosis not present

## 2018-03-25 DIAGNOSIS — Z299 Encounter for prophylactic measures, unspecified: Secondary | ICD-10-CM | POA: Diagnosis not present

## 2018-03-25 DIAGNOSIS — C8590 Non-Hodgkin lymphoma, unspecified, unspecified site: Secondary | ICD-10-CM | POA: Diagnosis not present

## 2018-03-25 DIAGNOSIS — Z713 Dietary counseling and surveillance: Secondary | ICD-10-CM | POA: Diagnosis not present

## 2018-03-25 DIAGNOSIS — Z6828 Body mass index (BMI) 28.0-28.9, adult: Secondary | ICD-10-CM | POA: Diagnosis not present

## 2018-05-08 DIAGNOSIS — F419 Anxiety disorder, unspecified: Secondary | ICD-10-CM | POA: Diagnosis not present

## 2018-05-08 DIAGNOSIS — Z299 Encounter for prophylactic measures, unspecified: Secondary | ICD-10-CM | POA: Diagnosis not present

## 2018-05-08 DIAGNOSIS — I10 Essential (primary) hypertension: Secondary | ICD-10-CM | POA: Diagnosis not present

## 2018-05-08 DIAGNOSIS — Z713 Dietary counseling and surveillance: Secondary | ICD-10-CM | POA: Diagnosis not present

## 2018-05-08 DIAGNOSIS — Z6828 Body mass index (BMI) 28.0-28.9, adult: Secondary | ICD-10-CM | POA: Diagnosis not present

## 2018-07-08 DIAGNOSIS — R350 Frequency of micturition: Secondary | ICD-10-CM | POA: Diagnosis not present

## 2018-07-08 DIAGNOSIS — Z6828 Body mass index (BMI) 28.0-28.9, adult: Secondary | ICD-10-CM | POA: Diagnosis not present

## 2018-07-08 DIAGNOSIS — F419 Anxiety disorder, unspecified: Secondary | ICD-10-CM | POA: Diagnosis not present

## 2018-07-08 DIAGNOSIS — Z299 Encounter for prophylactic measures, unspecified: Secondary | ICD-10-CM | POA: Diagnosis not present

## 2018-07-08 DIAGNOSIS — I10 Essential (primary) hypertension: Secondary | ICD-10-CM | POA: Diagnosis not present

## 2018-08-10 DIAGNOSIS — Z6828 Body mass index (BMI) 28.0-28.9, adult: Secondary | ICD-10-CM | POA: Diagnosis not present

## 2018-08-10 DIAGNOSIS — E785 Hyperlipidemia, unspecified: Secondary | ICD-10-CM | POA: Diagnosis not present

## 2018-08-10 DIAGNOSIS — I10 Essential (primary) hypertension: Secondary | ICD-10-CM | POA: Diagnosis not present

## 2018-08-10 DIAGNOSIS — Z299 Encounter for prophylactic measures, unspecified: Secondary | ICD-10-CM | POA: Diagnosis not present

## 2018-08-27 DIAGNOSIS — J320 Chronic maxillary sinusitis: Secondary | ICD-10-CM | POA: Diagnosis not present

## 2018-08-27 DIAGNOSIS — Z299 Encounter for prophylactic measures, unspecified: Secondary | ICD-10-CM | POA: Diagnosis not present

## 2018-08-27 DIAGNOSIS — I10 Essential (primary) hypertension: Secondary | ICD-10-CM | POA: Diagnosis not present

## 2018-08-27 DIAGNOSIS — C8590 Non-Hodgkin lymphoma, unspecified, unspecified site: Secondary | ICD-10-CM | POA: Diagnosis not present

## 2018-08-27 DIAGNOSIS — Z6827 Body mass index (BMI) 27.0-27.9, adult: Secondary | ICD-10-CM | POA: Diagnosis not present

## 2018-09-28 DIAGNOSIS — F419 Anxiety disorder, unspecified: Secondary | ICD-10-CM | POA: Diagnosis not present

## 2018-09-28 DIAGNOSIS — Z299 Encounter for prophylactic measures, unspecified: Secondary | ICD-10-CM | POA: Diagnosis not present

## 2018-09-28 DIAGNOSIS — Z6828 Body mass index (BMI) 28.0-28.9, adult: Secondary | ICD-10-CM | POA: Diagnosis not present

## 2018-09-28 DIAGNOSIS — Z789 Other specified health status: Secondary | ICD-10-CM | POA: Diagnosis not present

## 2018-09-28 DIAGNOSIS — C8590 Non-Hodgkin lymphoma, unspecified, unspecified site: Secondary | ICD-10-CM | POA: Diagnosis not present

## 2018-09-28 DIAGNOSIS — I10 Essential (primary) hypertension: Secondary | ICD-10-CM | POA: Diagnosis not present

## 2018-09-29 DIAGNOSIS — H524 Presbyopia: Secondary | ICD-10-CM | POA: Diagnosis not present

## 2018-11-09 DIAGNOSIS — Z6828 Body mass index (BMI) 28.0-28.9, adult: Secondary | ICD-10-CM | POA: Diagnosis not present

## 2018-11-09 DIAGNOSIS — I10 Essential (primary) hypertension: Secondary | ICD-10-CM | POA: Diagnosis not present

## 2018-11-09 DIAGNOSIS — E785 Hyperlipidemia, unspecified: Secondary | ICD-10-CM | POA: Diagnosis not present

## 2018-11-09 DIAGNOSIS — Z299 Encounter for prophylactic measures, unspecified: Secondary | ICD-10-CM | POA: Diagnosis not present

## 2018-11-09 DIAGNOSIS — C8590 Non-Hodgkin lymphoma, unspecified, unspecified site: Secondary | ICD-10-CM | POA: Diagnosis not present

## 2019-02-10 DIAGNOSIS — I10 Essential (primary) hypertension: Secondary | ICD-10-CM | POA: Diagnosis not present

## 2019-02-10 DIAGNOSIS — Z6829 Body mass index (BMI) 29.0-29.9, adult: Secondary | ICD-10-CM | POA: Diagnosis not present

## 2019-02-10 DIAGNOSIS — C8590 Non-Hodgkin lymphoma, unspecified, unspecified site: Secondary | ICD-10-CM | POA: Diagnosis not present

## 2019-02-10 DIAGNOSIS — Z299 Encounter for prophylactic measures, unspecified: Secondary | ICD-10-CM | POA: Diagnosis not present

## 2019-02-10 DIAGNOSIS — F419 Anxiety disorder, unspecified: Secondary | ICD-10-CM | POA: Diagnosis not present

## 2019-02-17 ENCOUNTER — Other Ambulatory Visit: Payer: Medicare HMO

## 2019-02-17 ENCOUNTER — Other Ambulatory Visit: Payer: Self-pay | Admitting: Internal Medicine

## 2019-02-17 DIAGNOSIS — R6889 Other general symptoms and signs: Secondary | ICD-10-CM | POA: Diagnosis not present

## 2019-02-17 DIAGNOSIS — Z20822 Contact with and (suspected) exposure to covid-19: Secondary | ICD-10-CM

## 2019-02-21 LAB — NOVEL CORONAVIRUS, NAA: SARS-CoV-2, NAA: NOT DETECTED

## 2019-05-14 DIAGNOSIS — Z6829 Body mass index (BMI) 29.0-29.9, adult: Secondary | ICD-10-CM | POA: Diagnosis not present

## 2019-05-14 DIAGNOSIS — C8590 Non-Hodgkin lymphoma, unspecified, unspecified site: Secondary | ICD-10-CM | POA: Diagnosis not present

## 2019-05-14 DIAGNOSIS — Z87891 Personal history of nicotine dependence: Secondary | ICD-10-CM | POA: Diagnosis not present

## 2019-05-14 DIAGNOSIS — I10 Essential (primary) hypertension: Secondary | ICD-10-CM | POA: Diagnosis not present

## 2019-05-14 DIAGNOSIS — Z299 Encounter for prophylactic measures, unspecified: Secondary | ICD-10-CM | POA: Diagnosis not present

## 2019-05-14 DIAGNOSIS — J069 Acute upper respiratory infection, unspecified: Secondary | ICD-10-CM | POA: Diagnosis not present

## 2019-06-09 DIAGNOSIS — Z299 Encounter for prophylactic measures, unspecified: Secondary | ICD-10-CM | POA: Diagnosis not present

## 2019-06-09 DIAGNOSIS — I10 Essential (primary) hypertension: Secondary | ICD-10-CM | POA: Diagnosis not present

## 2019-06-09 DIAGNOSIS — F419 Anxiety disorder, unspecified: Secondary | ICD-10-CM | POA: Diagnosis not present

## 2019-06-09 DIAGNOSIS — C8590 Non-Hodgkin lymphoma, unspecified, unspecified site: Secondary | ICD-10-CM | POA: Diagnosis not present

## 2019-06-09 DIAGNOSIS — F329 Major depressive disorder, single episode, unspecified: Secondary | ICD-10-CM | POA: Diagnosis not present

## 2019-06-09 DIAGNOSIS — Z6829 Body mass index (BMI) 29.0-29.9, adult: Secondary | ICD-10-CM | POA: Diagnosis not present

## 2019-07-01 DIAGNOSIS — N390 Urinary tract infection, site not specified: Secondary | ICD-10-CM | POA: Diagnosis not present

## 2019-07-01 DIAGNOSIS — Z1211 Encounter for screening for malignant neoplasm of colon: Secondary | ICD-10-CM | POA: Diagnosis not present

## 2019-07-01 DIAGNOSIS — Z1331 Encounter for screening for depression: Secondary | ICD-10-CM | POA: Diagnosis not present

## 2019-07-01 DIAGNOSIS — Z79899 Other long term (current) drug therapy: Secondary | ICD-10-CM | POA: Diagnosis not present

## 2019-07-01 DIAGNOSIS — Z7189 Other specified counseling: Secondary | ICD-10-CM | POA: Diagnosis not present

## 2019-07-01 DIAGNOSIS — E785 Hyperlipidemia, unspecified: Secondary | ICD-10-CM | POA: Diagnosis not present

## 2019-07-01 DIAGNOSIS — Z1339 Encounter for screening examination for other mental health and behavioral disorders: Secondary | ICD-10-CM | POA: Diagnosis not present

## 2019-07-01 DIAGNOSIS — I10 Essential (primary) hypertension: Secondary | ICD-10-CM | POA: Diagnosis not present

## 2019-07-01 DIAGNOSIS — F419 Anxiety disorder, unspecified: Secondary | ICD-10-CM | POA: Diagnosis not present

## 2019-07-01 DIAGNOSIS — R5383 Other fatigue: Secondary | ICD-10-CM | POA: Diagnosis not present

## 2019-07-01 DIAGNOSIS — Z0000 Encounter for general adult medical examination without abnormal findings: Secondary | ICD-10-CM | POA: Diagnosis not present

## 2019-08-18 ENCOUNTER — Ambulatory Visit: Payer: Medicare HMO | Attending: Internal Medicine

## 2019-08-18 ENCOUNTER — Other Ambulatory Visit: Payer: Self-pay

## 2019-08-18 DIAGNOSIS — Z20822 Contact with and (suspected) exposure to covid-19: Secondary | ICD-10-CM

## 2019-08-18 DIAGNOSIS — Z20828 Contact with and (suspected) exposure to other viral communicable diseases: Secondary | ICD-10-CM | POA: Diagnosis not present

## 2019-08-19 LAB — NOVEL CORONAVIRUS, NAA: SARS-CoV-2, NAA: DETECTED — AB

## 2019-10-05 DIAGNOSIS — F419 Anxiety disorder, unspecified: Secondary | ICD-10-CM | POA: Diagnosis not present

## 2019-10-05 DIAGNOSIS — Z6829 Body mass index (BMI) 29.0-29.9, adult: Secondary | ICD-10-CM | POA: Diagnosis not present

## 2019-10-05 DIAGNOSIS — F1320 Sedative, hypnotic or anxiolytic dependence, uncomplicated: Secondary | ICD-10-CM | POA: Diagnosis not present

## 2019-10-05 DIAGNOSIS — I10 Essential (primary) hypertension: Secondary | ICD-10-CM | POA: Diagnosis not present

## 2019-10-05 DIAGNOSIS — E785 Hyperlipidemia, unspecified: Secondary | ICD-10-CM | POA: Diagnosis not present

## 2019-10-05 DIAGNOSIS — Z87891 Personal history of nicotine dependence: Secondary | ICD-10-CM | POA: Diagnosis not present

## 2019-10-05 DIAGNOSIS — Z299 Encounter for prophylactic measures, unspecified: Secondary | ICD-10-CM | POA: Diagnosis not present

## 2019-10-11 DIAGNOSIS — Z1231 Encounter for screening mammogram for malignant neoplasm of breast: Secondary | ICD-10-CM | POA: Diagnosis not present

## 2019-10-24 DIAGNOSIS — F329 Major depressive disorder, single episode, unspecified: Secondary | ICD-10-CM | POA: Diagnosis not present

## 2019-10-24 DIAGNOSIS — I10 Essential (primary) hypertension: Secondary | ICD-10-CM | POA: Diagnosis not present

## 2019-11-04 DIAGNOSIS — H524 Presbyopia: Secondary | ICD-10-CM | POA: Diagnosis not present

## 2019-11-24 DIAGNOSIS — F329 Major depressive disorder, single episode, unspecified: Secondary | ICD-10-CM | POA: Diagnosis not present

## 2019-11-24 DIAGNOSIS — I10 Essential (primary) hypertension: Secondary | ICD-10-CM | POA: Diagnosis not present

## 2019-12-15 DIAGNOSIS — I10 Essential (primary) hypertension: Secondary | ICD-10-CM | POA: Diagnosis not present

## 2019-12-15 DIAGNOSIS — Z299 Encounter for prophylactic measures, unspecified: Secondary | ICD-10-CM | POA: Diagnosis not present

## 2019-12-15 DIAGNOSIS — C8590 Non-Hodgkin lymphoma, unspecified, unspecified site: Secondary | ICD-10-CM | POA: Diagnosis not present

## 2019-12-15 DIAGNOSIS — R300 Dysuria: Secondary | ICD-10-CM | POA: Diagnosis not present

## 2019-12-15 DIAGNOSIS — N390 Urinary tract infection, site not specified: Secondary | ICD-10-CM | POA: Diagnosis not present

## 2019-12-27 DIAGNOSIS — H26492 Other secondary cataract, left eye: Secondary | ICD-10-CM | POA: Diagnosis not present

## 2019-12-27 DIAGNOSIS — H35013 Changes in retinal vascular appearance, bilateral: Secondary | ICD-10-CM | POA: Diagnosis not present

## 2019-12-27 DIAGNOSIS — H35033 Hypertensive retinopathy, bilateral: Secondary | ICD-10-CM | POA: Diagnosis not present

## 2019-12-27 DIAGNOSIS — H26493 Other secondary cataract, bilateral: Secondary | ICD-10-CM | POA: Diagnosis not present

## 2019-12-27 DIAGNOSIS — Z961 Presence of intraocular lens: Secondary | ICD-10-CM | POA: Diagnosis not present

## 2020-01-04 DIAGNOSIS — F1320 Sedative, hypnotic or anxiolytic dependence, uncomplicated: Secondary | ICD-10-CM | POA: Diagnosis not present

## 2020-01-04 DIAGNOSIS — C8590 Non-Hodgkin lymphoma, unspecified, unspecified site: Secondary | ICD-10-CM | POA: Diagnosis not present

## 2020-01-04 DIAGNOSIS — I10 Essential (primary) hypertension: Secondary | ICD-10-CM | POA: Diagnosis not present

## 2020-01-04 DIAGNOSIS — R300 Dysuria: Secondary | ICD-10-CM | POA: Diagnosis not present

## 2020-01-04 DIAGNOSIS — Z299 Encounter for prophylactic measures, unspecified: Secondary | ICD-10-CM | POA: Diagnosis not present

## 2020-01-24 DIAGNOSIS — F419 Anxiety disorder, unspecified: Secondary | ICD-10-CM | POA: Diagnosis not present

## 2020-01-24 DIAGNOSIS — I10 Essential (primary) hypertension: Secondary | ICD-10-CM | POA: Diagnosis not present

## 2020-02-23 DIAGNOSIS — F329 Major depressive disorder, single episode, unspecified: Secondary | ICD-10-CM | POA: Diagnosis not present

## 2020-02-23 DIAGNOSIS — I10 Essential (primary) hypertension: Secondary | ICD-10-CM | POA: Diagnosis not present

## 2020-03-24 DIAGNOSIS — I10 Essential (primary) hypertension: Secondary | ICD-10-CM | POA: Diagnosis not present

## 2020-03-24 DIAGNOSIS — E7849 Other hyperlipidemia: Secondary | ICD-10-CM | POA: Diagnosis not present

## 2020-03-24 DIAGNOSIS — F329 Major depressive disorder, single episode, unspecified: Secondary | ICD-10-CM | POA: Diagnosis not present

## 2020-04-06 DIAGNOSIS — C8590 Non-Hodgkin lymphoma, unspecified, unspecified site: Secondary | ICD-10-CM | POA: Diagnosis not present

## 2020-04-06 DIAGNOSIS — Z299 Encounter for prophylactic measures, unspecified: Secondary | ICD-10-CM | POA: Diagnosis not present

## 2020-04-06 DIAGNOSIS — M79671 Pain in right foot: Secondary | ICD-10-CM | POA: Diagnosis not present

## 2020-04-06 DIAGNOSIS — I10 Essential (primary) hypertension: Secondary | ICD-10-CM | POA: Diagnosis not present

## 2020-04-06 DIAGNOSIS — F419 Anxiety disorder, unspecified: Secondary | ICD-10-CM | POA: Diagnosis not present

## 2020-04-21 DIAGNOSIS — M19071 Primary osteoarthritis, right ankle and foot: Secondary | ICD-10-CM | POA: Diagnosis not present

## 2020-04-21 DIAGNOSIS — M79671 Pain in right foot: Secondary | ICD-10-CM | POA: Diagnosis not present

## 2020-04-25 DIAGNOSIS — E7849 Other hyperlipidemia: Secondary | ICD-10-CM | POA: Diagnosis not present

## 2020-04-25 DIAGNOSIS — F329 Major depressive disorder, single episode, unspecified: Secondary | ICD-10-CM | POA: Diagnosis not present

## 2020-04-25 DIAGNOSIS — I10 Essential (primary) hypertension: Secondary | ICD-10-CM | POA: Diagnosis not present

## 2020-05-05 DIAGNOSIS — M19071 Primary osteoarthritis, right ankle and foot: Secondary | ICD-10-CM | POA: Diagnosis not present

## 2020-05-23 DIAGNOSIS — M79671 Pain in right foot: Secondary | ICD-10-CM | POA: Diagnosis not present

## 2020-05-23 DIAGNOSIS — I10 Essential (primary) hypertension: Secondary | ICD-10-CM | POA: Diagnosis not present

## 2020-05-23 DIAGNOSIS — C8590 Non-Hodgkin lymphoma, unspecified, unspecified site: Secondary | ICD-10-CM | POA: Diagnosis not present

## 2020-05-23 DIAGNOSIS — R42 Dizziness and giddiness: Secondary | ICD-10-CM | POA: Diagnosis not present

## 2020-05-23 DIAGNOSIS — Z299 Encounter for prophylactic measures, unspecified: Secondary | ICD-10-CM | POA: Diagnosis not present

## 2020-05-23 DIAGNOSIS — M19071 Primary osteoarthritis, right ankle and foot: Secondary | ICD-10-CM | POA: Diagnosis not present

## 2020-06-06 DIAGNOSIS — R42 Dizziness and giddiness: Secondary | ICD-10-CM | POA: Diagnosis not present

## 2020-06-06 DIAGNOSIS — F1320 Sedative, hypnotic or anxiolytic dependence, uncomplicated: Secondary | ICD-10-CM | POA: Diagnosis not present

## 2020-06-06 DIAGNOSIS — I10 Essential (primary) hypertension: Secondary | ICD-10-CM | POA: Diagnosis not present

## 2020-06-06 DIAGNOSIS — C8590 Non-Hodgkin lymphoma, unspecified, unspecified site: Secondary | ICD-10-CM | POA: Diagnosis not present

## 2020-06-06 DIAGNOSIS — Z299 Encounter for prophylactic measures, unspecified: Secondary | ICD-10-CM | POA: Diagnosis not present

## 2020-06-09 DIAGNOSIS — M19071 Primary osteoarthritis, right ankle and foot: Secondary | ICD-10-CM | POA: Diagnosis not present

## 2020-06-23 DIAGNOSIS — I10 Essential (primary) hypertension: Secondary | ICD-10-CM | POA: Diagnosis not present

## 2020-06-23 DIAGNOSIS — E7849 Other hyperlipidemia: Secondary | ICD-10-CM | POA: Diagnosis not present

## 2020-06-23 DIAGNOSIS — F329 Major depressive disorder, single episode, unspecified: Secondary | ICD-10-CM | POA: Diagnosis not present

## 2020-06-23 DIAGNOSIS — M19071 Primary osteoarthritis, right ankle and foot: Secondary | ICD-10-CM | POA: Diagnosis not present

## 2020-07-04 DIAGNOSIS — Z299 Encounter for prophylactic measures, unspecified: Secondary | ICD-10-CM | POA: Diagnosis not present

## 2020-07-04 DIAGNOSIS — Z1331 Encounter for screening for depression: Secondary | ICD-10-CM | POA: Diagnosis not present

## 2020-07-04 DIAGNOSIS — Z0000 Encounter for general adult medical examination without abnormal findings: Secondary | ICD-10-CM | POA: Diagnosis not present

## 2020-07-04 DIAGNOSIS — Z6829 Body mass index (BMI) 29.0-29.9, adult: Secondary | ICD-10-CM | POA: Diagnosis not present

## 2020-07-04 DIAGNOSIS — I10 Essential (primary) hypertension: Secondary | ICD-10-CM | POA: Diagnosis not present

## 2020-07-04 DIAGNOSIS — Z1339 Encounter for screening examination for other mental health and behavioral disorders: Secondary | ICD-10-CM | POA: Diagnosis not present

## 2020-07-04 DIAGNOSIS — Z1211 Encounter for screening for malignant neoplasm of colon: Secondary | ICD-10-CM | POA: Diagnosis not present

## 2020-07-04 DIAGNOSIS — Z7189 Other specified counseling: Secondary | ICD-10-CM | POA: Diagnosis not present

## 2020-07-04 DIAGNOSIS — Z23 Encounter for immunization: Secondary | ICD-10-CM | POA: Diagnosis not present

## 2020-07-05 DIAGNOSIS — E785 Hyperlipidemia, unspecified: Secondary | ICD-10-CM | POA: Diagnosis not present

## 2020-07-05 DIAGNOSIS — R5383 Other fatigue: Secondary | ICD-10-CM | POA: Diagnosis not present

## 2020-07-05 DIAGNOSIS — Z79899 Other long term (current) drug therapy: Secondary | ICD-10-CM | POA: Diagnosis not present

## 2020-07-18 ENCOUNTER — Encounter: Payer: Self-pay | Admitting: Internal Medicine

## 2020-08-25 DIAGNOSIS — E7849 Other hyperlipidemia: Secondary | ICD-10-CM | POA: Diagnosis not present

## 2020-08-25 DIAGNOSIS — F329 Major depressive disorder, single episode, unspecified: Secondary | ICD-10-CM | POA: Diagnosis not present

## 2020-08-25 DIAGNOSIS — I10 Essential (primary) hypertension: Secondary | ICD-10-CM | POA: Diagnosis not present

## 2020-09-05 DIAGNOSIS — F1320 Sedative, hypnotic or anxiolytic dependence, uncomplicated: Secondary | ICD-10-CM | POA: Diagnosis not present

## 2020-09-05 DIAGNOSIS — Z87891 Personal history of nicotine dependence: Secondary | ICD-10-CM | POA: Diagnosis not present

## 2020-09-05 DIAGNOSIS — Z299 Encounter for prophylactic measures, unspecified: Secondary | ICD-10-CM | POA: Diagnosis not present

## 2020-09-05 DIAGNOSIS — C8590 Non-Hodgkin lymphoma, unspecified, unspecified site: Secondary | ICD-10-CM | POA: Diagnosis not present

## 2020-09-05 DIAGNOSIS — I10 Essential (primary) hypertension: Secondary | ICD-10-CM | POA: Diagnosis not present

## 2020-09-05 DIAGNOSIS — J069 Acute upper respiratory infection, unspecified: Secondary | ICD-10-CM | POA: Diagnosis not present

## 2020-09-11 ENCOUNTER — Ambulatory Visit: Payer: Medicare HMO | Admitting: Gastroenterology

## 2020-09-25 DIAGNOSIS — F329 Major depressive disorder, single episode, unspecified: Secondary | ICD-10-CM | POA: Diagnosis not present

## 2020-09-25 DIAGNOSIS — I10 Essential (primary) hypertension: Secondary | ICD-10-CM | POA: Diagnosis not present

## 2020-09-25 DIAGNOSIS — E7849 Other hyperlipidemia: Secondary | ICD-10-CM | POA: Diagnosis not present

## 2020-10-03 DIAGNOSIS — R0789 Other chest pain: Secondary | ICD-10-CM | POA: Diagnosis not present

## 2020-10-03 DIAGNOSIS — R059 Cough, unspecified: Secondary | ICD-10-CM | POA: Diagnosis not present

## 2020-10-03 DIAGNOSIS — F32A Depression, unspecified: Secondary | ICD-10-CM | POA: Diagnosis not present

## 2020-10-03 DIAGNOSIS — C8590 Non-Hodgkin lymphoma, unspecified, unspecified site: Secondary | ICD-10-CM | POA: Diagnosis not present

## 2020-10-03 DIAGNOSIS — Z299 Encounter for prophylactic measures, unspecified: Secondary | ICD-10-CM | POA: Diagnosis not present

## 2020-10-03 DIAGNOSIS — I10 Essential (primary) hypertension: Secondary | ICD-10-CM | POA: Diagnosis not present

## 2020-10-18 ENCOUNTER — Telehealth: Payer: Self-pay | Admitting: *Deleted

## 2020-10-18 ENCOUNTER — Ambulatory Visit: Payer: Medicare HMO | Admitting: Gastroenterology

## 2020-10-18 ENCOUNTER — Encounter: Payer: Self-pay | Admitting: *Deleted

## 2020-10-18 ENCOUNTER — Other Ambulatory Visit: Payer: Self-pay

## 2020-10-18 ENCOUNTER — Encounter: Payer: Self-pay | Admitting: Gastroenterology

## 2020-10-18 DIAGNOSIS — R195 Other fecal abnormalities: Secondary | ICD-10-CM

## 2020-10-18 MED ORDER — PEG 3350-KCL-NA BICARB-NACL 420 G PO SOLR: ORAL | 0 refills | Status: AC

## 2020-10-18 NOTE — Telephone Encounter (Signed)
PA for colonoscopy approved via [employer]. Auth#  [id number] DOS 11/24/2020-12/24/2020

## 2020-10-18 NOTE — Patient Instructions (Signed)
1. Colonoscopy as scheduled. See separate instructions.  

## 2020-10-18 NOTE — Progress Notes (Signed)
Primary Care Physician:  Glenda Chroman, MD  Primary Gastroenterologist:  Elon Alas. [name], DO   Chief Complaint  Patient presents with  . positive blood in stool    Never had tcs    HPI:  Marissa Clayton is a 75 y.o. female here at the request of Dr. Woody Seller for further evaluation of heme positive stool.   Patient has never had a colonoscopy. She has brother who had colon cancer prior to age of 84. She denies constipation, diarrhea, melena, brbpr, abd pain, n/v, heartburn. No dysphagia. No weight loss. No ASA /NSAIDS  Current Outpatient Medications  Medication Sig Dispense Refill  . ALPRAZolam (XANAX) 0.5 MG tablet Take 0.5 mg by mouth at bedtime.    [name] amLODipine (NORVASC) 5 MG tablet Take 1 tablet by mouth daily.    . APPLE CIDER VINEGAR PO Take by mouth daily.    [name] atenolol (TENORMIN) 25 MG tablet Take 25 mg by mouth daily.    [name] BIOTIN PO Take by mouth daily.    . cholecalciferol (VITAMIN D) 1000 units tablet Take 1,000 Units by mouth daily.    . Garlic 3007 MG CAPS Take by mouth daily.    . Multiple Vitamin (MULTIVITAMIN) tablet Take 1 tablet by mouth daily.    . Multiple Vitamins-Minerals (HAIR SKIN AND NAILS FORMULA PO) Take 1 capsule by mouth daily. 2500 mcg    . Omega-3 Fatty Acids (FISH OIL) 1000 MG CAPS Take 1 capsule by mouth daily.    [name] venlafaxine XR (EFFEXOR-XR) 37.5 MG 24 hr capsule Take 37.5 mg by mouth daily.     No current facility-administered medications for this visit.   Facility-Administered Medications Ordered in Other Visits  Medication Dose Route Frequency Provider Last Rate Last Admin  . sodium chloride 0.9 % injection 10 mL  10 mL Intracatheter PRN Annia Belt, MD   10 mL at 04/12/13 1318    Allergies as of 10/18/2020  . (No Known Allergies)    Past Medical History:  Diagnosis Date  . Anxiety   . Arthritis    hands  . Asthma   . Cancer ([place])    Non Hodgkin's lymphoma  . COPD (chronic obstructive pulmonary disease) ([place])    per CXR  last year per pt.  . Depression   . Diabetes mellitus without complication ([place])    no longer issues, occured while on cancer medications  . History of cardiac catheterization 2011   heart cath clean per pt.  . Hypertension   . Nausea with vomiting 03/05/2013    Past Surgical History:  Procedure Laterality Date  . CARDIAC CATHETERIZATION  2011  . TUBAL LIGATION     age 45    Family History  Problem Relation Age of Onset  . Cancer Mother        oral ca  . Cancer Father        lung ca  . Cancer Brother        lung cancer  . Stomach cancer Paternal Grandmother   . Throat cancer Brother   . Colon cancer Brother        <age 76, committed suicide    Social History   Socioeconomic History  . Marital status: Widowed    Spouse name: Not on file  . Number of children: Not on file  . Years of education: Not on file  . Highest education level: Not on file  Occupational History  . Not on file  Tobacco Use  .  Smoking status: Former Smoker    Types: Cigarettes  . Smokeless tobacco: Never Used  Substance and Sexual Activity  . Alcohol use: No  . Drug use: No  . Sexual activity: Not on file  Other Topics Concern  . Not on file  Social History Narrative  . Not on file   Social Determinants of Health   Financial Resource Strain: Not on file  Food Insecurity: Not on file  Transportation Needs: Not on file  Physical Activity: Not on file  Stress: Not on file  Social Connections: Not on file  Intimate Partner Violence: Not on file      ROS:  General: Negative for anorexia, weight loss, fever, chills, fatigue, weakness. Eyes: Negative for vision changes.  ENT: Negative for hoarseness, difficulty swallowing , nasal congestion. CV: Negative for chest pain, angina, palpitations, dyspnea on exertion, peripheral edema.  Respiratory: Negative for dyspnea at rest, dyspnea on exertion, cough, sputum, wheezing.  GI: See history of present illness. GU:  Negative for dysuria,  hematuria, urinary incontinence, urinary frequency, nocturnal urination.  MS: Negative for joint pain, low back pain.  Derm: Negative for rash or itching.  Neuro: Negative for weakness, abnormal sensation, seizure, frequent headaches, memory loss, confusion.  Psych: Negative for anxiety, depression, suicidal ideation, hallucinations.  Endo: Negative for unusual weight change.  Heme: Negative for bruising or bleeding. Allergy: Negative for rash or hives.    Physical Examination:  BP 131/77   Pulse 76   Temp (!) 97.1 F (36.2 C) (Temporal)   Ht 5' 8" (1.727 m)   Wt 192 lb (87.1 kg)   BMI 29.19 kg/m    General: Well-nourished, well-developed in no acute distress.  Head: Normocephalic, atraumatic.   Eyes: Conjunctiva pink, no icterus. Mouth: masked Neck: Supple without thyromegaly, masses, or lymphadenopathy.  Lungs: Clear to auscultation bilaterally.  Heart: Regular rate and rhythm, no murmurs rubs or gallops.  Abdomen: Bowel sounds are normal, nontender, nondistended, no hepatosplenomegaly or masses, no abdominal bruits or    hernia , no rebound or guarding.   Rectal: Not performed Extremities: No lower extremity edema. No clubbing or deformities.  Neuro: Alert and oriented x 4 , grossly normal neurologically.  Skin: Warm and dry, no rash or jaundice.   Psych: Alert and cooperative, normal mood and affect.  Labs: Labs from November 2021: White blood cell count 4600, hemoglobin 13.1, MCV 91, platelets 191,000, TSH 1.290, BUN 11, creatinine 0.87, total bilirubin 0.6, alk phos 75, AST 17, ALT 13  Imaging Studies: No results found.  Assessment/Plan:  74 year old female presenting at the request of Dr.Vyas for further evaluation of Hemoccult positive stool.  No prior colonoscopy.  Family history significant for colon cancer in a first-degree relative before the age of 66.  Labs from November with no evidence of anemia.  Clinically patient without significant GI  symptoms.  Colonoscopy in the near future with Dr. [name]. ASA II.  I have discussed the risks, alternatives, benefits with regards to but not limited to the risk of reaction to medication, bleeding, infection, perforation and the patient is agreeable to proceed. Written consent to be obtained.

## 2020-10-18 NOTE — Progress Notes (Signed)
Cc'ed to pcp °

## 2020-11-22 ENCOUNTER — Other Ambulatory Visit (HOSPITAL_COMMUNITY)
Admission: RE | Admit: 2020-11-22 | Discharge: 2020-11-22 | Disposition: A | Discharge status: Home / Self Care | Payer: Medicare HMO | Source: Ambulatory Visit | Attending: Internal Medicine | Admitting: Internal Medicine

## 2020-11-22 ENCOUNTER — Other Ambulatory Visit: Payer: Self-pay

## 2020-11-22 DIAGNOSIS — Z20822 Contact with and (suspected) exposure to covid-19: Secondary | ICD-10-CM | POA: Insufficient documentation

## 2020-11-22 DIAGNOSIS — F329 Major depressive disorder, single episode, unspecified: Secondary | ICD-10-CM | POA: Diagnosis not present

## 2020-11-22 DIAGNOSIS — E7849 Other hyperlipidemia: Secondary | ICD-10-CM | POA: Diagnosis not present

## 2020-11-22 DIAGNOSIS — Z01812 Encounter for preprocedural laboratory examination: Secondary | ICD-10-CM | POA: Diagnosis not present

## 2020-11-22 DIAGNOSIS — K219 Gastro-esophageal reflux disease without esophagitis: Secondary | ICD-10-CM | POA: Diagnosis not present

## 2020-11-22 LAB — SARS CORONAVIRUS 2 (TAT 6-24 HRS): SARS Coronavirus 2: NEGATIVE

## 2020-11-24 ENCOUNTER — Encounter (HOSPITAL_COMMUNITY)
Admission: RE | Disposition: A | Discharge status: Home / Self Care | Payer: Self-pay | Source: Home / Self Care | Attending: Internal Medicine

## 2020-11-24 ENCOUNTER — Ambulatory Visit (HOSPITAL_COMMUNITY): Payer: Medicare HMO | Admitting: Anesthesiology

## 2020-11-24 ENCOUNTER — Ambulatory Visit (HOSPITAL_COMMUNITY)
Admission: RE | Admit: 2020-11-24 | Discharge: 2020-11-24 | Disposition: A | Discharge status: Home / Self Care | Payer: Medicare HMO | Attending: Internal Medicine | Admitting: Internal Medicine

## 2020-11-24 ENCOUNTER — Encounter (HOSPITAL_COMMUNITY): Payer: Self-pay

## 2020-11-24 ENCOUNTER — Other Ambulatory Visit: Payer: Self-pay

## 2020-11-24 DIAGNOSIS — D123 Benign neoplasm of transverse colon: Secondary | ICD-10-CM | POA: Insufficient documentation

## 2020-11-24 DIAGNOSIS — D124 Benign neoplasm of descending colon: Secondary | ICD-10-CM | POA: Diagnosis not present

## 2020-11-24 DIAGNOSIS — K5730 Diverticulosis of large intestine without perforation or abscess without bleeding: Secondary | ICD-10-CM | POA: Diagnosis not present

## 2020-11-24 DIAGNOSIS — Z8572 Personal history of non-Hodgkin lymphomas: Secondary | ICD-10-CM | POA: Diagnosis not present

## 2020-11-24 DIAGNOSIS — Z801 Family history of malignant neoplasm of trachea, bronchus and lung: Secondary | ICD-10-CM | POA: Diagnosis not present

## 2020-11-24 DIAGNOSIS — I10 Essential (primary) hypertension: Secondary | ICD-10-CM | POA: Insufficient documentation

## 2020-11-24 DIAGNOSIS — Z79899 Other long term (current) drug therapy: Secondary | ICD-10-CM | POA: Insufficient documentation

## 2020-11-24 DIAGNOSIS — K635 Polyp of colon: Secondary | ICD-10-CM | POA: Diagnosis not present

## 2020-11-24 DIAGNOSIS — Z87891 Personal history of nicotine dependence: Secondary | ICD-10-CM | POA: Diagnosis not present

## 2020-11-24 DIAGNOSIS — E119 Type 2 diabetes mellitus without complications: Secondary | ICD-10-CM | POA: Diagnosis not present

## 2020-11-24 DIAGNOSIS — K648 Other hemorrhoids: Secondary | ICD-10-CM | POA: Insufficient documentation

## 2020-11-24 DIAGNOSIS — Z800 Family history of malignant neoplasm of digestive organs: Secondary | ICD-10-CM | POA: Diagnosis not present

## 2020-11-24 DIAGNOSIS — R195 Other fecal abnormalities: Secondary | ICD-10-CM | POA: Diagnosis not present

## 2020-11-24 HISTORY — PX: POLYPECTOMY: SHX5525

## 2020-11-24 HISTORY — PX: COLONOSCOPY WITH PROPOFOL: SHX5780

## 2020-11-24 SURGERY — COLONOSCOPY WITH PROPOFOL
Anesthesia: General

## 2020-11-24 MED ORDER — PROPOFOL 500 MG/50ML IV EMUL
INTRAVENOUS | Status: DC | PRN
  Administered 2020-11-24: 125 ug/kg/min via INTRAVENOUS

## 2020-11-24 MED ORDER — PROPOFOL 10 MG/ML IV BOLUS
INTRAVENOUS | Status: DC | PRN
  Administered 2020-11-24: 20 mg via INTRAVENOUS
  Administered 2020-11-24: 80 mg via INTRAVENOUS
  Administered 2020-11-24: 20 mg via INTRAVENOUS

## 2020-11-24 MED ORDER — LACTATED RINGERS IV SOLN
INTRAVENOUS | Status: DC
  Administered 2020-11-24: 1000 mL via INTRAVENOUS

## 2020-11-24 NOTE — Op Note (Signed)
[facility] Patient Name: Marissa Clayton Procedure Date: 11/24/2020 12:11 PM MRN: [id number] Date of Birth: 11/10/1945 Attending MD: Elon Alas. [name] DO CSN: [id number] Age: 75 Admit Type: Outpatient Procedure:                Colonoscopy Indications:              Heme positive stool Providers:                Elon Alas. [name], DO, Janeece Riggers, RN, Aram Candela Referring MD:              Medicines:                See the Anesthesia note for documentation of the                            administered medications Complications:            No immediate complications. Estimated Blood Loss:     Estimated blood loss was minimal. Procedure:                Pre-Anesthesia Assessment:                           - The anesthesia plan was to use monitored                            anesthesia care (MAC).                           After obtaining informed consent, the colonoscope                            was passed under direct vision. Throughout the                            procedure, the patient's blood pressure, pulse, and                            oxygen saturations were monitored continuously. The                            [id number]-HQ190L ([id number]) scope was introduced through                            the anus and advanced to the the cecum, identified                            by appendiceal orifice and ileocecal valve. The                            colonoscopy was performed without difficulty. The                            patient tolerated the procedure well. The quality                            of the  bowel preparation was evaluated using the                            BBPS [facility] Bowel Preparation Scale) with scores                            of: Right Colon = 2 (minor amount of residual                            staining, small fragments of stool and/or opaque                            liquid, but mucosa seen well), Transverse Colon = 3                             (entire mucosa seen well with no residual staining,                            small fragments of stool or opaque liquid) and Left                            Colon = 2 (minor amount of residual staining, small                            fragments of stool and/or opaque liquid, but mucosa                            seen well). The total BBPS score equals 7. The                            quality of the bowel preparation was fair. Scope In: 12:22:17 PM Scope Out: 12:40:56 PM Scope Withdrawal Time: 0 hours 11 minutes 16 seconds  Total Procedure Duration: 0 hours 18 minutes 39 seconds  Findings:      The perianal and digital rectal examinations were normal.      Non-bleeding internal hemorrhoids were found during endoscopy.      Multiple small and large-mouthed diverticula were found in the sigmoid       colon and descending colon.      A 12 mm polyp was found in the proximal transverse colon. The polyp was       flat. The polyp was removed with a cold snare. Resection and retrieval       were complete.      Three sessile polyps were found in the descending colon. The polyps were       3 to 5 mm in size. These polyps were removed with a cold snare.       Resection and retrieval were complete. Impression:               - Preparation of the colon was fair.                           - Non-bleeding internal hemorrhoids.                           -  Diverticulosis in the sigmoid colon and in the                            descending colon.                           - One 12 mm polyp in the proximal transverse colon,                            removed with a cold snare. Resected and retrieved.                           - Three 3 to 5 mm polyps in the descending colon,                            removed with a cold snare. Resected and retrieved. Moderate Sedation:      Per Anesthesia Care Recommendation:           - Patient has a contact number available for                             emergencies. The signs and symptoms of potential                            delayed complications were discussed with the                            patient. Return to normal activities tomorrow.                            Written discharge instructions were provided to the                            patient.                           - Resume previous diet.                           - Continue present medications.                           - Await pathology results.                           - Repeat colonoscopy in 3 years for surveillance.                           - Return to GI clinic PRN. Procedure Code(s):        --- Professional ---                           [phone], Colonoscopy, flexible; with removal of                            tumor(s),  polyp(s), or other lesion(s) by snare                            technique Diagnosis Code(s):        --- Professional ---                           K64.8, Other hemorrhoids                           K63.5, Polyp of colon                           R19.5, Other fecal abnormalities                           K57.30, Diverticulosis of large intestine without                            perforation or abscess without bleeding CPT copyright 2019 American Medical Association. All rights reserved. The codes documented in this report are preliminary and upon coder review may  be revised to meet current compliance requirements. Elon Alas. [name], DO [place] [name], DO 11/24/2020 12:44:27 PM This report has been signed electronically. Number of Addenda: 0

## 2020-11-24 NOTE — Anesthesia Preprocedure Evaluation (Addendum)
Anesthesia Evaluation  Patient identified by MRN, date of birth, ID band Patient awake    Reviewed: Allergy & Precautions, NPO status , Patient's Chart, lab work & pertinent test results  Airway Mallampati: II  TM Distance: >3 FB Neck ROM: Full    Dental  (+) Upper Dentures, Lower Dentures   Pulmonary asthma , COPD,  COPD inhaler, former smoker,    Pulmonary exam normal breath sounds clear to auscultation       Cardiovascular Exercise Tolerance: Good hypertension, Pt. on medications Normal cardiovascular exam Rhythm:Regular Rate:Normal     Neuro/Psych PSYCHIATRIC DISORDERS Anxiety Depression    GI/Hepatic negative GI ROS, Neg liver ROS,   Endo/Other  diabetes  Renal/GU negative Renal ROS     Musculoskeletal  (+) Arthritis ,   Abdominal   Peds  Hematology  (+) Blood dyscrasia (lymphoma), ,   Anesthesia Other Findings   Reproductive/Obstetrics                            Anesthesia Physical Anesthesia Plan  ASA: II  Anesthesia Plan: General   Post-op Pain Management:    Induction: Intravenous  PONV Risk Score and Plan: Propofol infusion  Airway Management Planned: Nasal Cannula and Natural Airway  Additional Equipment:   Intra-op Plan:   Post-operative Plan:   Informed Consent: I have reviewed the patients History and Physical, chart, labs and discussed the procedure including the risks, benefits and alternatives for the proposed anesthesia with the patient or authorized representative who has indicated his/her understanding and acceptance.     Dental advisory given  Plan Discussed with: CRNA and Surgeon  Anesthesia Plan Comments:         Anesthesia Quick Evaluation

## 2020-11-24 NOTE — Discharge Instructions (Addendum)
Colonoscopy Discharge Instructions  Read the instructions outlined below and refer to this sheet in the next few weeks. These discharge instructions provide you with general information on caring for yourself after you leave the hospital. Your doctor may also give you specific instructions. While your treatment has been planned according to the most current medical practices available, unavoidable complications occasionally occur.   ACTIVITY  You may resume your regular activity, but move at a slower pace for the next 24 hours.   Take frequent rest periods for the next 24 hours.   Walking will help get rid of the air and reduce the bloated feeling in your belly (abdomen).   No driving for 24 hours (because of the medicine (anesthesia) used during the test).    Do not sign any important legal documents or operate any machinery for 24 hours (because of the anesthesia used during the test).  NUTRITION  Drink plenty of fluids.   You may resume your normal diet as instructed by your doctor.   Begin with a light meal and progress to your normal diet. Heavy or fried foods are harder to digest and may make you feel sick to your stomach (nauseated).   Avoid alcoholic beverages for 24 hours or as instructed.  MEDICATIONS  You may resume your normal medications unless your doctor tells you otherwise.  WHAT YOU CAN EXPECT TODAY  Some feelings of bloating in the abdomen.   Passage of more gas than usual.   Spotting of blood in your stool or on the toilet paper.  IF YOU HAD POLYPS REMOVED DURING THE COLONOSCOPY:  No aspirin products for 7 days or as instructed.   No alcohol for 7 days or as instructed.   Eat a soft diet for the next 24 hours.  FINDING OUT THE RESULTS OF YOUR TEST Not all test results are available during your visit. If your test results are not back during the visit, make an appointment with your caregiver to find out the results. Do not assume everything is normal if  you have not heard from your caregiver or the medical facility. It is important for you to follow up on all of your test results.  SEEK IMMEDIATE MEDICAL ATTENTION IF:  You have more than a spotting of blood in your stool.   Your belly is swollen (abdominal distention).   You are nauseated or vomiting.   You have a temperature over 101.   You have abdominal pain or discomfort that is severe or gets worse throughout the day.   Your colonoscopy revealed 4 polyp(s) which I removed successfully. Await pathology results, my office will contact you. I recommend repeating colonoscopy in 3 years for surveillance purposes. You also have diverticulosis and internal hemorrhoids. I would recommend increasing fiber in your diet or adding OTC Benefiber/Metamucil. Be sure to drink at least 4 to 6 glasses of water daily. Follow-up with GI as needed.    I hope you have a great rest of your week!  Elon Alas. [name], D.O. [facility]   Hemorrhoids Hemorrhoids are swollen veins that may develop:  In the butt (rectum). These are called internal hemorrhoids.  Around the opening of the butt (anus). These are called external hemorrhoids. Hemorrhoids can cause pain, itching, or bleeding. Most of the time, they do not cause serious problems. They usually get better with diet changes, lifestyle changes, and other home treatments. What are the causes? This condition may be caused by:  Having  trouble pooping (constipation).  Pushing hard (straining) to poop.  Watery poop (diarrhea).  Pregnancy.  Being very overweight (obese).  Sitting for long periods of time.  Heavy lifting or other activity that causes you to strain.  Anal sex.  Riding a bike for a long period of time. What are the signs or symptoms? Symptoms of this condition include:  Pain.  Itching or soreness in the butt.  Bleeding from the butt.  Leaking  poop.  Swelling in the area.  One or more lumps around the opening of your butt. How is this diagnosed? A doctor can often diagnose this condition by looking at the affected area. The doctor may also:  Do an exam that involves feeling the area with a gloved hand (digital rectal exam).  Examine the area inside your butt using a small tube (anoscope).  Order blood tests. This may be done if you have lost a lot of blood.  Have you get a test that involves looking inside the colon using a flexible tube with a camera on the end (sigmoidoscopy or colonoscopy). How is this treated? This condition can usually be treated at home. Your doctor may tell you to change what you eat, make lifestyle changes, or try home treatments. If these do not help, procedures can be done to remove the hemorrhoids or make them smaller. These may involve:  Placing rubber bands at the base of the hemorrhoids to cut off their blood supply.  Injecting medicine into the hemorrhoids to shrink them.  Shining a type of light energy onto the hemorrhoids to cause them to fall off.  Doing surgery to remove the hemorrhoids or cut off their blood supply. Follow these instructions at home: Eating and drinking  Eat foods that have a lot of fiber in them. These include whole grains, beans, nuts, fruits, and vegetables.  Ask your doctor about taking products that have added fiber (fibersupplements).  Reduce the amount of fat in your diet. You can do this by: ? Eating low-fat dairy products. ? Eating less red meat. ? Avoiding processed foods.  Drink enough fluid to keep your pee (urine) pale yellow.   Managing pain and swelling  Take a warm-water bath (sitz bath) for 20 minutes to ease pain. Do this 3-4 times a day. You may do this in a bathtub or using a portable sitz bath that fits over the toilet.  If told, put ice on the painful area. It may be helpful to use ice between your warm baths. ? Put ice in a plastic  bag. ? Place a towel between your skin and the bag. ? Leave the ice on for 20 minutes, 2-3 times a day.   General instructions  Take over-the-counter and prescription medicines only as told by your doctor. ? Medicated creams and medicines may be used as told.  Exercise often. Ask your doctor how much and what kind of exercise is best for you.  Go to the bathroom when you have the urge to poop. Do not wait.  Avoid pushing too hard when you poop.  Keep your butt dry and clean. Use wet toilet paper or moist towelettes after pooping.  Do not sit on the toilet for a long time.  Keep all follow-up visits as told by your doctor. This is important. Contact a doctor if you:  Have pain and swelling that do not get better with treatment or medicine.  Have trouble pooping.  Cannot poop.  Have pain or swelling outside the  area of the hemorrhoids. Get help right away if you have:  Bleeding that will not stop. Summary  Hemorrhoids are swollen veins in the butt or around the opening of the butt.  They can cause pain, itching, or bleeding.  Eat foods that have a lot of fiber in them. These include whole grains, beans, nuts, fruits, and vegetables.  Take a warm-water bath (sitz bath) for 20 minutes to ease pain. Do this 3-4 times a day. This information is not intended to replace advice given to you by your health care provider. Make sure you discuss any questions you have with your health care provider. Document Revised: 08/20/2018 Document Reviewed: 01/01/2018 Elsevier Patient Education  2021 [employer].  Diverticulosis  Diverticulosis is a condition that develops when small pouches (diverticula) form in the wall of the large intestine (colon). The colon is where water is absorbed and stool (feces) is formed. The pouches form when the inside layer of the colon pushes through weak spots in the outer layers of the colon. You may have a few pouches or many of them. The pouches usually  do not cause problems unless they become inflamed or infected. When this happens, the condition is called diverticulitis. What are the causes? The cause of this condition is not known. What increases the risk? The following factors may make you more likely to develop this condition:  Being older than age 85. Your risk for this condition increases with age. Diverticulosis is rare among people younger than age 29. By age 70, many people have it.  Eating a low-fiber diet.  Having frequent constipation.  Being overweight.  Not getting enough exercise.  Smoking.  Taking over-the-counter pain medicines, like aspirin and ibuprofen.  Having a family history of diverticulosis. What are the signs or symptoms? In most people, there are no symptoms of this condition. If you do have symptoms, they may include:  Bloating.  Cramps in the abdomen.  Constipation or diarrhea.  Pain in the lower left side of the abdomen. How is this diagnosed? Because diverticulosis usually has no symptoms, it is most often diagnosed during an exam for other colon problems. The condition may be diagnosed by:  Using a flexible scope to examine the colon (colonoscopy).  Taking an X-ray of the colon after dye has been put into the colon (barium enema).  Having a CT scan. How is this treated? You may not need treatment for this condition. Your health care provider may recommend treatment to prevent problems. You may need treatment if you have symptoms or if you previously had diverticulitis. Treatment may include:  Eating a high-fiber diet.  Taking a fiber supplement.  Taking a live bacteria supplement (probiotic).  Taking medicine to relax your colon.   Follow these instructions at home: Medicines  Take over-the-counter and prescription medicines only as told by your health care provider.  If told by your health care provider, take a fiber supplement or probiotic. Constipation prevention Your  condition may cause constipation. To prevent or treat constipation, you may need to:  Drink enough fluid to keep your urine pale yellow.  Take over-the-counter or prescription medicines.  Eat foods that are high in fiber, such as beans, whole grains, and fresh fruits and vegetables.  Limit foods that are high in fat and processed sugars, such as fried or sweet foods.   General instructions  Try not to strain when you have a bowel movement.  Keep all follow-up visits as told by your health care  provider. This is important. Contact a health care provider if you:  Have pain in your abdomen.  Have bloating.  Have cramps.  Have not had a bowel movement in 3 days. Get help right away if:  Your pain gets worse.  Your bloating becomes very bad.  You have a fever or chills, and your symptoms suddenly get worse.  You vomit.  You have bowel movements that are bloody or black.  You have bleeding from your rectum. Summary  Diverticulosis is a condition that develops when small pouches (diverticula) form in the wall of the large intestine (colon).  You may have a few pouches or many of them.  This condition is most often diagnosed during an exam for other colon problems.  Treatment may include increasing the fiber in your diet, taking supplements, or taking medicines. This information is not intended to replace advice given to you by your health care provider. Make sure you discuss any questions you have with your health care provider. Document Revised: 03/11/2019 Document Reviewed: 03/11/2019 Elsevier Patient Education  [employer].  Colon Polyps  Colon polyps are tissue growths inside the colon, which is part of the large intestine. They are one of the types of polyps that can grow in the body. A polyp may be a round bump or a mushroom-shaped growth. You could have one polyp or more than one. Most colon polyps are noncancerous (benign). However, some colon polyps can  become cancerous over time. Finding and removing the polyps early can help prevent this. What are the causes? The exact cause of colon polyps is not known. What increases the risk? The following factors may make you more likely to develop this condition:  Having a family history of colorectal cancer or colon polyps.  Being older than 75 years of age.  Being younger than 75 years of age and having a significant family history of colorectal cancer or colon polyps or a genetic condition that puts you at higher risk of getting colon polyps.  Having inflammatory bowel disease, such as ulcerative colitis or Crohn's disease.  Having certain conditions passed from parent to child (hereditary conditions), such as: ? Familial adenomatous polyposis (FAP). ? Lynch syndrome. ? Turcot syndrome. ? Peutz-Jeghers syndrome. ? MUTYH-associated polyposis (MAP).  Being overweight.  Certain lifestyle factors. These include smoking cigarettes, drinking too much alcohol, not getting enough exercise, and eating a diet that is high in fat and red meat and low in fiber.  Having had childhood cancer that was treated with radiation of the abdomen. What are the signs or symptoms? Many times, there are no symptoms. If you have symptoms, they may include:  Blood coming from the rectum during a bowel movement.  Blood in the stool (feces). The blood may be bright red or very dark in color.  Pain in the abdomen.  A change in bowel habits, such as constipation or diarrhea. How is this diagnosed? This condition is diagnosed with a colonoscopy. This is a procedure in which a lighted, flexible scope is inserted into the opening between the buttocks (anus) and then passed into the colon to examine the area. Polyps are sometimes found when a colonoscopy is done as part of routine cancer screening tests. How is this treated? This condition is treated by removing any polyps that are found. Most polyps can be removed  during a colonoscopy. Those polyps will then be tested for cancer. Additional treatment may be needed depending on the results of testing. Follow these  instructions at home: Eating and drinking  Eat foods that are high in fiber, such as fruits, vegetables, and whole grains.  Eat foods that are high in calcium and vitamin D, such as milk, cheese, yogurt, eggs, liver, fish, and broccoli.  Limit foods that are high in fat, such as fried foods and desserts.  Limit the amount of red meat, precooked or cured meat, or other processed meat that you eat, such as hot dogs, sausages, bacon, or meat loaves.  Limit sugary drinks.   Lifestyle  Maintain a healthy weight, or lose weight if recommended by your health care provider.  Exercise every day or as told by your health care provider.  Do not use any products that contain nicotine or tobacco, such as cigarettes, e-cigarettes, and chewing tobacco. If you need help quitting, ask your health care provider.  Do not drink alcohol if: ? Your health care provider tells you not to drink. ? You are pregnant, may be pregnant, or are planning to become pregnant.  If you drink alcohol: ? Limit how much you use to:  0-1 drink a day for women.  0-2 drinks a day for men. ? Know how much alcohol is in your drink. In the [place] one drink equals one 12 oz bottle of beer (355 mL), one 5 oz glass of wine (148 mL), or one 1 oz glass of hard liquor (44 mL). General instructions  Take over-the-counter and prescription medicines only as told by your health care provider.  Keep all follow-up visits. This is important. This includes having regularly scheduled colonoscopies. Talk to your health care provider about when you need a colonoscopy. Contact a health care provider if:  You have new or worsening bleeding during a bowel movement.  You have new or increased blood in your stool.  You have a change in bowel habits.  You lose weight for no known  reason. Summary  Colon polyps are tissue growths inside the colon, which is part of the large intestine. They are one type of polyp that can grow in the body.  Most colon polyps are noncancerous (benign), but some can become cancerous over time.  This condition is diagnosed with a colonoscopy.  This condition is treated by removing any polyps that are found. Most polyps can be removed during a colonoscopy. This information is not intended to replace advice given to you by your health care provider. Make sure you discuss any questions you have with your health care provider. Document Revised: 12/01/2019 Document Reviewed: 12/01/2019 Elsevier Patient Education  2021 [employer].

## 2020-11-24 NOTE — Anesthesia Postprocedure Evaluation (Signed)
Anesthesia Post Note  Patient: Marissa Clayton  Procedure(s) Performed: COLONOSCOPY WITH PROPOFOL (N/A ) POLYPECTOMY  Patient location during evaluation: Phase II Anesthesia Type: General Level of consciousness: awake and alert Pain management: pain level controlled Vital Signs Assessment: post-procedure vital signs reviewed and stable Respiratory status: spontaneous breathing and respiratory function stable Cardiovascular status: blood pressure returned to baseline and stable Postop Assessment: no apparent nausea or vomiting Anesthetic complications: no   No complications documented.   Last Vitals:  Vitals:   11/24/20 1152  BP: 139/83  Pulse: 71  Resp: 11  Temp: 36.5 C  SpO2: 98%    Last Pain:  Vitals:   11/24/20 1152  TempSrc: Oral  PainSc: 0-No pain                 Karna Dupes

## 2020-11-24 NOTE — H&P (Signed)
Primary Care Physician:  Glenda Chroman, MD Primary Gastroenterologist:  Dr. [name]  Pre-Procedure History & Physical: HPI:  Marissa Clayton is a 75 y.o. female is here for a colonoscopy for hemoccult positive stool. Patient has never had a colonoscopy. She has brother who had colon cancer prior to age of 31. She denies constipation, diarrhea, melena, brbpr, abd pain, n/v, heartburn. No dysphagia. No weight loss. No ASA /NSAIDS  Past Medical History:  Diagnosis Date  . Anxiety   . Arthritis    hands  . Asthma   . Cancer ([place])    Non Hodgkin's lymphoma  . COPD (chronic obstructive pulmonary disease) ([place])    per CXR last year per pt.  . Depression   . Diabetes mellitus without complication ([place])    no longer issues, occured while on cancer medications  . History of cardiac catheterization 2011   heart cath clean per pt.  . Hypertension   . Nausea with vomiting 03/05/2013    Past Surgical History:  Procedure Laterality Date  . CARDIAC CATHETERIZATION  2011  . TUBAL LIGATION     age 56    Prior to Admission medications   Medication Sig Start Date End Date Taking? Authorizing Provider  ALPRAZolam Duanne Moron) 0.5 MG tablet Take 0.5 mg by mouth at bedtime. 01/31/17  Yes [provider]  amLODipine (NORVASC) 5 MG tablet Take 1 tablet by mouth daily. 07/11/20  Yes [provider]  atenolol (TENORMIN) 25 MG tablet Take 25 mg by mouth daily.   Yes [provider]  polyethylene glycol-electrolytes (NULYTELY) 420 g solution As directed 10/18/20  Yes Hurshel Keys K, DO  venlafaxine XR (EFFEXOR-XR) 37.5 MG 24 hr capsule Take 37.5 mg by mouth daily. 02/15/17  Yes [provider]  APPLE CIDER VINEGAR PO Take by mouth daily.    [provider]  BIOTIN PO Take by mouth daily.    [provider]  cholecalciferol (VITAMIN D) 1000 units tablet Take 1,000 Units by mouth daily.    [provider]  Garlic 0737 MG CAPS Take by mouth daily.     [provider]  Multiple Vitamin (MULTIVITAMIN) tablet Take 1 tablet by mouth daily.    [provider]  Multiple Vitamins-Minerals (HAIR SKIN AND NAILS FORMULA PO) Take 1 capsule by mouth daily. 2500 mcg    [provider]  Omega-3 Fatty Acids (FISH OIL) 1000 MG CAPS Take 1 capsule by mouth daily.    [provider]    Allergies as of 10/18/2020  . (No Known Allergies)    Family History  Problem Relation Age of Onset  . Cancer Mother        oral ca  . Cancer Father        lung ca  . Cancer Brother        lung cancer  . Stomach cancer Paternal Grandmother   . Throat cancer Brother   . Colon cancer Brother        <age 48, committed suicide    Social History   Socioeconomic History  . Marital status: Widowed    Spouse name: Not on file  . Number of children: Not on file  . Years of education: Not on file  . Highest education level: Not on file  Occupational History  . Not on file  Tobacco Use  . Smoking status: Former Smoker    Types: Cigarettes  . Smokeless tobacco: Never Used  Vaping Use  . Vaping Use:  Never used  Substance and Sexual Activity  . Alcohol use: No  . Drug use: No  . Sexual activity: Not on file  Other Topics Concern  . Not on file  Social History Narrative  . Not on file   Social Determinants of Health   Financial Resource Strain: Not on file  Food Insecurity: Not on file  Transportation Needs: Not on file  Physical Activity: Not on file  Stress: Not on file  Social Connections: Not on file  Intimate Partner Violence: Not on file    Review of Systems: See HPI, otherwise negative ROS  Physical Exam: Vital signs in last 24 hours:     General:   Alert,  Well-developed, well-nourished, pleasant and cooperative in NAD Head:  Normocephalic and atraumatic. Eyes:  Sclera clear, no icterus.   Conjunctiva pink. Ears:  Normal auditory acuity. Nose:  No deformity, discharge,  or lesions. Mouth:  No  deformity or lesions, dentition normal. Neck:  Supple; no masses or thyromegaly. Lungs:  Clear throughout to auscultation.   No wheezes, crackles, or rhonchi. No acute distress. Heart:  Regular rate and rhythm; no murmurs, clicks, rubs,  or gallops. Abdomen:  Soft, nontender and nondistended. No masses, hepatosplenomegaly or hernias noted. Normal bowel sounds, without guarding, and without rebound.   Msk:  Symmetrical without gross deformities. Normal posture. Extremities:  Without clubbing or edema. Neurologic:  Alert and  oriented x4;  grossly normal neurologically. Skin:  Intact without significant lesions or rashes. Cervical Nodes:  No significant cervical adenopathy. Psych:  Alert and cooperative. Normal mood and affect.  Impression/Plan: Fuller Song is here for a colonoscopy for hemoccult positive stool.  The risks of the procedure including infection, bleed, or perforation as well as benefits, limitations, alternatives and imponderables have been reviewed with the patient. Questions have been answered. All parties agreeable.

## 2020-11-24 NOTE — Transfer of Care (Signed)
Immediate Anesthesia Transfer of Care Note  Patient: Marissa Clayton  Procedure(s) Performed: COLONOSCOPY WITH PROPOFOL (N/A ) POLYPECTOMY  Patient Location: PACU  Anesthesia Type:General  Level of Consciousness: awake and alert   Airway & Oxygen Therapy: Patient Spontanous Breathing  Post-op Assessment: Report given to RN  Post vital signs: Reviewed and stable  Last Vitals:  Vitals Value Taken Time  BP    Temp    Pulse    Resp    SpO2      Last Pain:  Vitals:   11/24/20 1152  TempSrc: Oral  PainSc: 0-No pain      Patients Stated Pain Goal: 5 (09/32/35 5732)  Complications: No complications documented.

## 2020-11-27 LAB — SURGICAL PATHOLOGY

## 2020-11-30 ENCOUNTER — Encounter (HOSPITAL_COMMUNITY): Payer: Self-pay | Admitting: Internal Medicine

## 2020-12-14 ENCOUNTER — Other Ambulatory Visit: Payer: Self-pay

## 2020-12-14 ENCOUNTER — Encounter: Payer: Self-pay | Admitting: Nurse Practitioner

## 2020-12-14 NOTE — Progress Notes (Signed)
Dear Marissa Clayton,   Your biopsies showed inflammation in the stomach but you were negative for H. Pylori. Please continue to take your Nexium daily and to avoid NSAID's (anything with aspirin in it, except for Tylenol).  We hope that the dilation helped with your swallowing. Follow-up with GI as previously scheduled.  If you have any questions please call our office.   Thank you, Oleh Genin, CMA

## 2020-12-20 DIAGNOSIS — Z299 Encounter for prophylactic measures, unspecified: Secondary | ICD-10-CM | POA: Diagnosis not present

## 2020-12-20 DIAGNOSIS — C8590 Non-Hodgkin lymphoma, unspecified, unspecified site: Secondary | ICD-10-CM | POA: Diagnosis not present

## 2020-12-20 DIAGNOSIS — F1320 Sedative, hypnotic or anxiolytic dependence, uncomplicated: Secondary | ICD-10-CM | POA: Diagnosis not present

## 2020-12-20 DIAGNOSIS — J069 Acute upper respiratory infection, unspecified: Secondary | ICD-10-CM | POA: Diagnosis not present

## 2020-12-20 DIAGNOSIS — Z87891 Personal history of nicotine dependence: Secondary | ICD-10-CM | POA: Diagnosis not present

## 2020-12-20 DIAGNOSIS — I10 Essential (primary) hypertension: Secondary | ICD-10-CM | POA: Diagnosis not present

## 2021-01-04 DIAGNOSIS — H6981 Other specified disorders of Eustachian tube, right ear: Secondary | ICD-10-CM | POA: Diagnosis not present

## 2021-01-04 DIAGNOSIS — C8590 Non-Hodgkin lymphoma, unspecified, unspecified site: Secondary | ICD-10-CM | POA: Diagnosis not present

## 2021-01-04 DIAGNOSIS — Z299 Encounter for prophylactic measures, unspecified: Secondary | ICD-10-CM | POA: Diagnosis not present

## 2021-01-04 DIAGNOSIS — F419 Anxiety disorder, unspecified: Secondary | ICD-10-CM | POA: Diagnosis not present

## 2021-01-04 DIAGNOSIS — I10 Essential (primary) hypertension: Secondary | ICD-10-CM | POA: Diagnosis not present

## 2021-02-22 DIAGNOSIS — E1165 Type 2 diabetes mellitus with hyperglycemia: Secondary | ICD-10-CM | POA: Diagnosis not present

## 2021-02-22 DIAGNOSIS — I10 Essential (primary) hypertension: Secondary | ICD-10-CM | POA: Diagnosis not present

## 2021-03-25 DIAGNOSIS — I10 Essential (primary) hypertension: Secondary | ICD-10-CM | POA: Diagnosis not present

## 2021-03-25 DIAGNOSIS — E1165 Type 2 diabetes mellitus with hyperglycemia: Secondary | ICD-10-CM | POA: Diagnosis not present

## 2021-04-10 DIAGNOSIS — C8590 Non-Hodgkin lymphoma, unspecified, unspecified site: Secondary | ICD-10-CM | POA: Diagnosis not present

## 2021-04-10 DIAGNOSIS — F1320 Sedative, hypnotic or anxiolytic dependence, uncomplicated: Secondary | ICD-10-CM | POA: Diagnosis not present

## 2021-04-10 DIAGNOSIS — Z6829 Body mass index (BMI) 29.0-29.9, adult: Secondary | ICD-10-CM | POA: Diagnosis not present

## 2021-04-10 DIAGNOSIS — Z299 Encounter for prophylactic measures, unspecified: Secondary | ICD-10-CM | POA: Diagnosis not present

## 2021-04-10 DIAGNOSIS — F419 Anxiety disorder, unspecified: Secondary | ICD-10-CM | POA: Diagnosis not present

## 2021-04-10 DIAGNOSIS — I10 Essential (primary) hypertension: Secondary | ICD-10-CM | POA: Diagnosis not present

## 2021-06-25 DIAGNOSIS — F419 Anxiety disorder, unspecified: Secondary | ICD-10-CM | POA: Diagnosis not present

## 2021-06-25 DIAGNOSIS — I10 Essential (primary) hypertension: Secondary | ICD-10-CM | POA: Diagnosis not present

## 2021-07-25 DIAGNOSIS — I10 Essential (primary) hypertension: Secondary | ICD-10-CM | POA: Diagnosis not present

## 2021-07-25 DIAGNOSIS — F419 Anxiety disorder, unspecified: Secondary | ICD-10-CM | POA: Diagnosis not present

## 2021-07-26 DIAGNOSIS — E785 Hyperlipidemia, unspecified: Secondary | ICD-10-CM | POA: Diagnosis not present

## 2021-07-26 DIAGNOSIS — Z79899 Other long term (current) drug therapy: Secondary | ICD-10-CM | POA: Diagnosis not present

## 2021-07-26 DIAGNOSIS — Z7189 Other specified counseling: Secondary | ICD-10-CM | POA: Diagnosis not present

## 2021-07-26 DIAGNOSIS — Z1331 Encounter for screening for depression: Secondary | ICD-10-CM | POA: Diagnosis not present

## 2021-07-26 DIAGNOSIS — Z299 Encounter for prophylactic measures, unspecified: Secondary | ICD-10-CM | POA: Diagnosis not present

## 2021-07-26 DIAGNOSIS — I10 Essential (primary) hypertension: Secondary | ICD-10-CM | POA: Diagnosis not present

## 2021-07-26 DIAGNOSIS — Z0000 Encounter for general adult medical examination without abnormal findings: Secondary | ICD-10-CM | POA: Diagnosis not present

## 2021-07-26 DIAGNOSIS — Z1339 Encounter for screening examination for other mental health and behavioral disorders: Secondary | ICD-10-CM | POA: Diagnosis not present

## 2021-07-26 DIAGNOSIS — F17210 Nicotine dependence, cigarettes, uncomplicated: Secondary | ICD-10-CM | POA: Diagnosis not present

## 2021-07-26 DIAGNOSIS — R5383 Other fatigue: Secondary | ICD-10-CM | POA: Diagnosis not present

## 2021-07-26 DIAGNOSIS — Z6829 Body mass index (BMI) 29.0-29.9, adult: Secondary | ICD-10-CM | POA: Diagnosis not present

## 2021-07-26 DIAGNOSIS — R739 Hyperglycemia, unspecified: Secondary | ICD-10-CM | POA: Diagnosis not present

## 2021-08-22 DIAGNOSIS — R509 Fever, unspecified: Secondary | ICD-10-CM | POA: Diagnosis not present

## 2021-08-22 DIAGNOSIS — Z6829 Body mass index (BMI) 29.0-29.9, adult: Secondary | ICD-10-CM | POA: Diagnosis not present

## 2021-08-22 DIAGNOSIS — Z299 Encounter for prophylactic measures, unspecified: Secondary | ICD-10-CM | POA: Diagnosis not present

## 2021-08-22 DIAGNOSIS — J069 Acute upper respiratory infection, unspecified: Secondary | ICD-10-CM | POA: Diagnosis not present

## 2021-08-22 DIAGNOSIS — R350 Frequency of micturition: Secondary | ICD-10-CM | POA: Diagnosis not present

## 2021-10-08 DIAGNOSIS — Z299 Encounter for prophylactic measures, unspecified: Secondary | ICD-10-CM | POA: Diagnosis not present

## 2021-10-08 DIAGNOSIS — F1320 Sedative, hypnotic or anxiolytic dependence, uncomplicated: Secondary | ICD-10-CM | POA: Diagnosis not present

## 2021-10-08 DIAGNOSIS — C8590 Non-Hodgkin lymphoma, unspecified, unspecified site: Secondary | ICD-10-CM | POA: Diagnosis not present

## 2021-10-08 DIAGNOSIS — J069 Acute upper respiratory infection, unspecified: Secondary | ICD-10-CM | POA: Diagnosis not present

## 2021-10-08 DIAGNOSIS — I10 Essential (primary) hypertension: Secondary | ICD-10-CM | POA: Diagnosis not present

## 2021-10-30 DIAGNOSIS — R350 Frequency of micturition: Secondary | ICD-10-CM | POA: Diagnosis not present

## 2021-10-30 DIAGNOSIS — Z789 Other specified health status: Secondary | ICD-10-CM | POA: Diagnosis not present

## 2021-10-30 DIAGNOSIS — I10 Essential (primary) hypertension: Secondary | ICD-10-CM | POA: Diagnosis not present

## 2021-10-30 DIAGNOSIS — Z299 Encounter for prophylactic measures, unspecified: Secondary | ICD-10-CM | POA: Diagnosis not present

## 2021-10-30 DIAGNOSIS — N390 Urinary tract infection, site not specified: Secondary | ICD-10-CM | POA: Diagnosis not present

## 2021-10-30 DIAGNOSIS — I700 Atherosclerosis of aorta: Secondary | ICD-10-CM | POA: Diagnosis not present

## 2022-02-05 DIAGNOSIS — Z789 Other specified health status: Secondary | ICD-10-CM | POA: Diagnosis not present

## 2022-02-05 DIAGNOSIS — I10 Essential (primary) hypertension: Secondary | ICD-10-CM | POA: Diagnosis not present

## 2022-02-05 DIAGNOSIS — F419 Anxiety disorder, unspecified: Secondary | ICD-10-CM | POA: Diagnosis not present

## 2022-02-05 DIAGNOSIS — Z299 Encounter for prophylactic measures, unspecified: Secondary | ICD-10-CM | POA: Diagnosis not present

## 2022-05-08 DIAGNOSIS — I700 Atherosclerosis of aorta: Secondary | ICD-10-CM | POA: Diagnosis not present

## 2022-05-08 DIAGNOSIS — Z789 Other specified health status: Secondary | ICD-10-CM | POA: Diagnosis not present

## 2022-05-08 DIAGNOSIS — Z299 Encounter for prophylactic measures, unspecified: Secondary | ICD-10-CM | POA: Diagnosis not present

## 2022-05-08 DIAGNOSIS — F1320 Sedative, hypnotic or anxiolytic dependence, uncomplicated: Secondary | ICD-10-CM | POA: Diagnosis not present

## 2022-05-08 DIAGNOSIS — I10 Essential (primary) hypertension: Secondary | ICD-10-CM | POA: Diagnosis not present

## 2022-05-08 DIAGNOSIS — F339 Major depressive disorder, recurrent, unspecified: Secondary | ICD-10-CM | POA: Diagnosis not present

## 2022-06-21 DIAGNOSIS — I10 Essential (primary) hypertension: Secondary | ICD-10-CM | POA: Diagnosis not present

## 2022-06-21 DIAGNOSIS — U071 COVID-19: Secondary | ICD-10-CM | POA: Diagnosis not present

## 2022-07-30 DIAGNOSIS — F339 Major depressive disorder, recurrent, unspecified: Secondary | ICD-10-CM | POA: Diagnosis not present

## 2022-07-30 DIAGNOSIS — Z1331 Encounter for screening for depression: Secondary | ICD-10-CM | POA: Diagnosis not present

## 2022-07-30 DIAGNOSIS — Z0000 Encounter for general adult medical examination without abnormal findings: Secondary | ICD-10-CM | POA: Diagnosis not present

## 2022-07-30 DIAGNOSIS — Z789 Other specified health status: Secondary | ICD-10-CM | POA: Diagnosis not present

## 2022-07-30 DIAGNOSIS — Z299 Encounter for prophylactic measures, unspecified: Secondary | ICD-10-CM | POA: Diagnosis not present

## 2022-07-30 DIAGNOSIS — Z6829 Body mass index (BMI) 29.0-29.9, adult: Secondary | ICD-10-CM | POA: Diagnosis not present

## 2022-07-30 DIAGNOSIS — Z1339 Encounter for screening examination for other mental health and behavioral disorders: Secondary | ICD-10-CM | POA: Diagnosis not present

## 2022-07-30 DIAGNOSIS — I10 Essential (primary) hypertension: Secondary | ICD-10-CM | POA: Diagnosis not present

## 2022-07-30 DIAGNOSIS — Z7189 Other specified counseling: Secondary | ICD-10-CM | POA: Diagnosis not present

## 2022-07-31 DIAGNOSIS — R5383 Other fatigue: Secondary | ICD-10-CM | POA: Diagnosis not present

## 2022-07-31 DIAGNOSIS — R739 Hyperglycemia, unspecified: Secondary | ICD-10-CM | POA: Diagnosis not present

## 2022-07-31 DIAGNOSIS — M8580 Other specified disorders of bone density and structure, unspecified site: Secondary | ICD-10-CM | POA: Diagnosis not present

## 2022-07-31 DIAGNOSIS — F419 Anxiety disorder, unspecified: Secondary | ICD-10-CM | POA: Diagnosis not present

## 2022-07-31 DIAGNOSIS — Z79899 Other long term (current) drug therapy: Secondary | ICD-10-CM | POA: Diagnosis not present

## 2022-07-31 DIAGNOSIS — I700 Atherosclerosis of aorta: Secondary | ICD-10-CM | POA: Diagnosis not present

## 2022-08-07 ENCOUNTER — Other Ambulatory Visit: Payer: Self-pay | Admitting: Internal Medicine

## 2022-08-07 ENCOUNTER — Ambulatory Visit
Admission: RE | Admit: 2022-08-07 | Discharge: 2022-08-07 | Disposition: A | Discharge status: Home / Self Care | Payer: Medicare HMO | Source: Ambulatory Visit | Attending: Internal Medicine | Admitting: Internal Medicine

## 2022-08-07 DIAGNOSIS — Z1231 Encounter for screening mammogram for malignant neoplasm of breast: Secondary | ICD-10-CM

## 2022-08-09 DIAGNOSIS — E2839 Other primary ovarian failure: Secondary | ICD-10-CM | POA: Diagnosis not present

## 2022-12-24 DIAGNOSIS — F1320 Sedative, hypnotic or anxiolytic dependence, uncomplicated: Secondary | ICD-10-CM | POA: Diagnosis not present

## 2022-12-24 DIAGNOSIS — I10 Essential (primary) hypertension: Secondary | ICD-10-CM | POA: Diagnosis not present

## 2022-12-24 DIAGNOSIS — F419 Anxiety disorder, unspecified: Secondary | ICD-10-CM | POA: Diagnosis not present

## 2022-12-24 DIAGNOSIS — I700 Atherosclerosis of aorta: Secondary | ICD-10-CM | POA: Diagnosis not present

## 2022-12-24 DIAGNOSIS — Z299 Encounter for prophylactic measures, unspecified: Secondary | ICD-10-CM | POA: Diagnosis not present

## 2023-03-13 DIAGNOSIS — M8580 Other specified disorders of bone density and structure, unspecified site: Secondary | ICD-10-CM | POA: Diagnosis not present

## 2023-03-13 DIAGNOSIS — Z7189 Other specified counseling: Secondary | ICD-10-CM | POA: Diagnosis not present

## 2023-03-13 DIAGNOSIS — Z1339 Encounter for screening examination for other mental health and behavioral disorders: Secondary | ICD-10-CM | POA: Diagnosis not present

## 2023-03-13 DIAGNOSIS — Z79899 Other long term (current) drug therapy: Secondary | ICD-10-CM | POA: Diagnosis not present

## 2023-03-13 DIAGNOSIS — E7800 Pure hypercholesterolemia, unspecified: Secondary | ICD-10-CM | POA: Diagnosis not present

## 2023-03-13 DIAGNOSIS — Z1331 Encounter for screening for depression: Secondary | ICD-10-CM | POA: Diagnosis not present

## 2023-03-13 DIAGNOSIS — Z299 Encounter for prophylactic measures, unspecified: Secondary | ICD-10-CM | POA: Diagnosis not present

## 2023-03-13 DIAGNOSIS — Z0000 Encounter for general adult medical examination without abnormal findings: Secondary | ICD-10-CM | POA: Diagnosis not present

## 2023-03-13 DIAGNOSIS — R5383 Other fatigue: Secondary | ICD-10-CM | POA: Diagnosis not present

## 2023-03-13 DIAGNOSIS — I10 Essential (primary) hypertension: Secondary | ICD-10-CM | POA: Diagnosis not present

## 2023-03-13 DIAGNOSIS — F339 Major depressive disorder, recurrent, unspecified: Secondary | ICD-10-CM | POA: Diagnosis not present

## 2023-04-15 DIAGNOSIS — Z299 Encounter for prophylactic measures, unspecified: Secondary | ICD-10-CM | POA: Diagnosis not present

## 2023-04-15 DIAGNOSIS — F339 Major depressive disorder, recurrent, unspecified: Secondary | ICD-10-CM | POA: Diagnosis not present

## 2023-04-15 DIAGNOSIS — I700 Atherosclerosis of aorta: Secondary | ICD-10-CM | POA: Diagnosis not present

## 2023-04-15 DIAGNOSIS — Z0000 Encounter for general adult medical examination without abnormal findings: Secondary | ICD-10-CM | POA: Diagnosis not present

## 2023-04-15 DIAGNOSIS — I10 Essential (primary) hypertension: Secondary | ICD-10-CM | POA: Diagnosis not present

## 2023-04-15 DIAGNOSIS — F1320 Sedative, hypnotic or anxiolytic dependence, uncomplicated: Secondary | ICD-10-CM | POA: Diagnosis not present

## 2023-07-16 DIAGNOSIS — Z299 Encounter for prophylactic measures, unspecified: Secondary | ICD-10-CM | POA: Diagnosis not present

## 2023-07-16 DIAGNOSIS — I700 Atherosclerosis of aorta: Secondary | ICD-10-CM | POA: Diagnosis not present

## 2023-07-16 DIAGNOSIS — F419 Anxiety disorder, unspecified: Secondary | ICD-10-CM | POA: Diagnosis not present

## 2023-07-16 DIAGNOSIS — I10 Essential (primary) hypertension: Secondary | ICD-10-CM | POA: Diagnosis not present

## 2023-07-16 DIAGNOSIS — J45909 Unspecified asthma, uncomplicated: Secondary | ICD-10-CM | POA: Diagnosis not present

## 2023-08-08 DIAGNOSIS — Z299 Encounter for prophylactic measures, unspecified: Secondary | ICD-10-CM | POA: Diagnosis not present

## 2023-08-08 DIAGNOSIS — I10 Essential (primary) hypertension: Secondary | ICD-10-CM | POA: Diagnosis not present

## 2023-08-08 DIAGNOSIS — R059 Cough, unspecified: Secondary | ICD-10-CM | POA: Diagnosis not present

## 2023-08-08 DIAGNOSIS — J069 Acute upper respiratory infection, unspecified: Secondary | ICD-10-CM | POA: Diagnosis not present

## 2023-08-08 DIAGNOSIS — R058 Other specified cough: Secondary | ICD-10-CM | POA: Diagnosis not present

## 2023-08-08 DIAGNOSIS — R0989 Other specified symptoms and signs involving the circulatory and respiratory systems: Secondary | ICD-10-CM | POA: Diagnosis not present

## 2023-08-08 DIAGNOSIS — J029 Acute pharyngitis, unspecified: Secondary | ICD-10-CM | POA: Diagnosis not present

## 2023-08-08 DIAGNOSIS — R062 Wheezing: Secondary | ICD-10-CM | POA: Diagnosis not present

## 2023-10-16 DIAGNOSIS — Z299 Encounter for prophylactic measures, unspecified: Secondary | ICD-10-CM | POA: Diagnosis not present

## 2023-10-16 DIAGNOSIS — I10 Essential (primary) hypertension: Secondary | ICD-10-CM | POA: Diagnosis not present

## 2023-10-16 DIAGNOSIS — F1320 Sedative, hypnotic or anxiolytic dependence, uncomplicated: Secondary | ICD-10-CM | POA: Diagnosis not present

## 2023-10-16 DIAGNOSIS — I700 Atherosclerosis of aorta: Secondary | ICD-10-CM | POA: Diagnosis not present

## 2023-10-16 DIAGNOSIS — F339 Major depressive disorder, recurrent, unspecified: Secondary | ICD-10-CM | POA: Diagnosis not present

## 2023-12-23 DIAGNOSIS — I10 Essential (primary) hypertension: Secondary | ICD-10-CM | POA: Diagnosis not present

## 2023-12-23 DIAGNOSIS — Z299 Encounter for prophylactic measures, unspecified: Secondary | ICD-10-CM | POA: Diagnosis not present

## 2023-12-23 DIAGNOSIS — I700 Atherosclerosis of aorta: Secondary | ICD-10-CM | POA: Diagnosis not present

## 2023-12-23 DIAGNOSIS — F419 Anxiety disorder, unspecified: Secondary | ICD-10-CM | POA: Diagnosis not present

## 2023-12-23 DIAGNOSIS — R600 Localized edema: Secondary | ICD-10-CM | POA: Diagnosis not present

## 2024-01-07 ENCOUNTER — Encounter: Payer: Self-pay | Admitting: *Deleted

## 2024-01-20 DIAGNOSIS — R600 Localized edema: Secondary | ICD-10-CM | POA: Diagnosis not present

## 2024-01-20 DIAGNOSIS — I700 Atherosclerosis of aorta: Secondary | ICD-10-CM | POA: Diagnosis not present

## 2024-01-20 DIAGNOSIS — F339 Major depressive disorder, recurrent, unspecified: Secondary | ICD-10-CM | POA: Diagnosis not present

## 2024-01-20 DIAGNOSIS — F419 Anxiety disorder, unspecified: Secondary | ICD-10-CM | POA: Diagnosis not present

## 2024-01-20 DIAGNOSIS — Z299 Encounter for prophylactic measures, unspecified: Secondary | ICD-10-CM | POA: Diagnosis not present

## 2024-01-20 DIAGNOSIS — I10 Essential (primary) hypertension: Secondary | ICD-10-CM | POA: Diagnosis not present

## 2024-01-29 DIAGNOSIS — Z0000 Encounter for general adult medical examination without abnormal findings: Secondary | ICD-10-CM | POA: Diagnosis not present

## 2024-01-29 DIAGNOSIS — Z7189 Other specified counseling: Secondary | ICD-10-CM | POA: Diagnosis not present

## 2024-01-29 DIAGNOSIS — R739 Hyperglycemia, unspecified: Secondary | ICD-10-CM | POA: Diagnosis not present

## 2024-01-29 DIAGNOSIS — Z1331 Encounter for screening for depression: Secondary | ICD-10-CM | POA: Diagnosis not present

## 2024-01-29 DIAGNOSIS — Z1339 Encounter for screening examination for other mental health and behavioral disorders: Secondary | ICD-10-CM | POA: Diagnosis not present

## 2024-01-29 DIAGNOSIS — R5383 Other fatigue: Secondary | ICD-10-CM | POA: Diagnosis not present

## 2024-01-29 DIAGNOSIS — Z299 Encounter for prophylactic measures, unspecified: Secondary | ICD-10-CM | POA: Diagnosis not present

## 2024-01-29 DIAGNOSIS — E559 Vitamin D deficiency, unspecified: Secondary | ICD-10-CM | POA: Diagnosis not present

## 2024-01-29 DIAGNOSIS — Z79899 Other long term (current) drug therapy: Secondary | ICD-10-CM | POA: Diagnosis not present

## 2024-01-29 DIAGNOSIS — E7800 Pure hypercholesterolemia, unspecified: Secondary | ICD-10-CM | POA: Diagnosis not present

## 2024-01-29 DIAGNOSIS — F1320 Sedative, hypnotic or anxiolytic dependence, uncomplicated: Secondary | ICD-10-CM | POA: Diagnosis not present

## 2024-02-20 ENCOUNTER — Other Ambulatory Visit: Payer: Self-pay | Admitting: Internal Medicine

## 2024-02-20 DIAGNOSIS — Z1231 Encounter for screening mammogram for malignant neoplasm of breast: Secondary | ICD-10-CM

## 2024-02-24 ENCOUNTER — Inpatient Hospital Stay: Admission: RE | Admit: 2024-02-24 | Source: Ambulatory Visit

## 2024-03-10 DIAGNOSIS — R5383 Other fatigue: Secondary | ICD-10-CM | POA: Diagnosis not present

## 2024-03-10 DIAGNOSIS — E538 Deficiency of other specified B group vitamins: Secondary | ICD-10-CM | POA: Diagnosis not present

## 2024-03-10 DIAGNOSIS — R350 Frequency of micturition: Secondary | ICD-10-CM | POA: Diagnosis not present

## 2024-03-10 DIAGNOSIS — I10 Essential (primary) hypertension: Secondary | ICD-10-CM | POA: Diagnosis not present

## 2024-03-10 DIAGNOSIS — R52 Pain, unspecified: Secondary | ICD-10-CM | POA: Diagnosis not present

## 2024-03-10 DIAGNOSIS — Z299 Encounter for prophylactic measures, unspecified: Secondary | ICD-10-CM | POA: Diagnosis not present

## 2024-03-12 DIAGNOSIS — N390 Urinary tract infection, site not specified: Secondary | ICD-10-CM | POA: Diagnosis not present

## 2024-03-12 DIAGNOSIS — I10 Essential (primary) hypertension: Secondary | ICD-10-CM | POA: Diagnosis not present

## 2024-03-12 DIAGNOSIS — M545 Low back pain, unspecified: Secondary | ICD-10-CM | POA: Diagnosis not present

## 2024-03-12 DIAGNOSIS — Z299 Encounter for prophylactic measures, unspecified: Secondary | ICD-10-CM | POA: Diagnosis not present

## 2024-03-30 ENCOUNTER — Ambulatory Visit
Admission: RE | Admit: 2024-03-30 | Discharge: 2024-03-30 | Disposition: A | Discharge status: Home / Self Care | Payer: Self-pay | Source: Ambulatory Visit | Attending: Internal Medicine | Admitting: Internal Medicine

## 2024-03-30 DIAGNOSIS — Z1231 Encounter for screening mammogram for malignant neoplasm of breast: Secondary | ICD-10-CM

## 2024-04-14 DIAGNOSIS — Z299 Encounter for prophylactic measures, unspecified: Secondary | ICD-10-CM | POA: Diagnosis not present

## 2024-04-14 DIAGNOSIS — Z6829 Body mass index (BMI) 29.0-29.9, adult: Secondary | ICD-10-CM | POA: Diagnosis not present

## 2024-04-14 DIAGNOSIS — I10 Essential (primary) hypertension: Secondary | ICD-10-CM | POA: Diagnosis not present

## 2024-04-14 DIAGNOSIS — Z0000 Encounter for general adult medical examination without abnormal findings: Secondary | ICD-10-CM | POA: Diagnosis not present

## 2024-04-14 DIAGNOSIS — F1320 Sedative, hypnotic or anxiolytic dependence, uncomplicated: Secondary | ICD-10-CM | POA: Diagnosis not present

## 2024-05-27 DIAGNOSIS — Z299 Encounter for prophylactic measures, unspecified: Secondary | ICD-10-CM | POA: Diagnosis not present

## 2024-05-27 DIAGNOSIS — I10 Essential (primary) hypertension: Secondary | ICD-10-CM | POA: Diagnosis not present

## 2024-05-27 DIAGNOSIS — F331 Major depressive disorder, recurrent, moderate: Secondary | ICD-10-CM | POA: Diagnosis not present

## 2024-05-27 DIAGNOSIS — R601 Generalized edema: Secondary | ICD-10-CM | POA: Diagnosis not present
